# Patient Record
Sex: Female | Born: 1945 | Race: White | Hispanic: No | Smoking: Never smoker
Health system: Southern US, Community
[De-identification: ages and names within clinical notes are randomized; demographics above are authoritative.]

## PROBLEM LIST (undated history)

## (undated) DIAGNOSIS — F0390 Unspecified dementia without behavioral disturbance: Secondary | ICD-10-CM

## (undated) HISTORY — PX: GALLBLADDER SURGERY: SHX652

---

## 2015-10-25 ENCOUNTER — Emergency Department (HOSPITAL_COMMUNITY)
Admission: EM | Admit: 2015-10-25 | Discharge: 2015-10-25 | Disposition: A | Discharge status: Home / Self Care | Payer: Medicare HMO | Attending: Emergency Medicine | Admitting: Emergency Medicine

## 2015-10-25 ENCOUNTER — Encounter (HOSPITAL_COMMUNITY): Payer: Self-pay | Admitting: Emergency Medicine

## 2015-10-25 ENCOUNTER — Emergency Department (HOSPITAL_COMMUNITY): Payer: Medicare HMO

## 2015-10-25 DIAGNOSIS — Y939 Activity, unspecified: Secondary | ICD-10-CM | POA: Diagnosis not present

## 2015-10-25 DIAGNOSIS — S0990XA Unspecified injury of head, initial encounter: Secondary | ICD-10-CM | POA: Diagnosis present

## 2015-10-25 DIAGNOSIS — I10 Essential (primary) hypertension: Secondary | ICD-10-CM | POA: Diagnosis not present

## 2015-10-25 DIAGNOSIS — S0181XA Laceration without foreign body of other part of head, initial encounter: Secondary | ICD-10-CM | POA: Insufficient documentation

## 2015-10-25 DIAGNOSIS — Z23 Encounter for immunization: Secondary | ICD-10-CM | POA: Diagnosis not present

## 2015-10-25 DIAGNOSIS — Y9289 Other specified places as the place of occurrence of the external cause: Secondary | ICD-10-CM | POA: Diagnosis not present

## 2015-10-25 DIAGNOSIS — Z79899 Other long term (current) drug therapy: Secondary | ICD-10-CM | POA: Diagnosis not present

## 2015-10-25 DIAGNOSIS — W19XXXA Unspecified fall, initial encounter: Secondary | ICD-10-CM | POA: Insufficient documentation

## 2015-10-25 DIAGNOSIS — Y999 Unspecified external cause status: Secondary | ICD-10-CM | POA: Diagnosis not present

## 2015-10-25 HISTORY — DX: Unspecified dementia, unspecified severity, without behavioral disturbance, psychotic disturbance, mood disturbance, and anxiety: F03.90

## 2015-10-25 LAB — CBC WITH DIFFERENTIAL/PLATELET
Basophils Absolute: 0 10*3/uL (ref 0.0–0.1)
Basophils Relative: 0 %
Eosinophils Absolute: 0.1 10*3/uL (ref 0.0–0.7)
Eosinophils Relative: 1 %
HEMATOCRIT: 41.5 % (ref 36.0–46.0)
HEMOGLOBIN: 13.3 g/dL (ref 12.0–15.0)
LYMPHS ABS: 1.6 10*3/uL (ref 0.7–4.0)
Lymphocytes Relative: 15 %
MCH: 29.7 pg (ref 26.0–34.0)
MCHC: 32 g/dL (ref 30.0–36.0)
MCV: 92.6 fL (ref 78.0–100.0)
MONO ABS: 0.7 10*3/uL (ref 0.1–1.0)
MONOS PCT: 6 %
NEUTROS ABS: 8.6 10*3/uL — AB (ref 1.7–7.7)
NEUTROS PCT: 78 %
Platelets: 227 10*3/uL (ref 150–400)
RBC: 4.48 MIL/uL (ref 3.87–5.11)
RDW: 13.2 % (ref 11.5–15.5)
WBC: 11 10*3/uL — ABNORMAL HIGH (ref 4.0–10.5)

## 2015-10-25 LAB — BASIC METABOLIC PANEL
Anion gap: 7 (ref 5–15)
BUN: 13 mg/dL (ref 6–20)
CHLORIDE: 107 mmol/L (ref 101–111)
CO2: 29 mmol/L (ref 22–32)
CREATININE: 0.64 mg/dL (ref 0.44–1.00)
Calcium: 8.9 mg/dL (ref 8.9–10.3)
GFR calc non Af Amer: >60 mL/min (ref >60)
GLUCOSE: 98 mg/dL (ref 65–99)
Potassium: 3.5 mmol/L (ref 3.5–5.1)
Sodium: 143 mmol/L (ref 135–145)

## 2015-10-25 MED ORDER — TETANUS-DIPHTH-ACELL PERTUSSIS 5-2.5-18.5 LF-MCG/0.5 IM SUSP
0.5000 mL | Freq: Once | INTRAMUSCULAR | Status: AC
  Administered 2015-10-25: 0.5 mL via INTRAMUSCULAR
  Filled 2015-10-25: qty 0.5

## 2015-10-25 NOTE — ED Notes (Signed)
Patient transported to CT 

## 2015-10-25 NOTE — ED Triage Notes (Addendum)
Per EMS, patient had an unwitnessed fall sometime during the night. Patient found laying in floor beside bed. Patient has a red knot on her forehead. Patient is not complaining of any pain at this time. Hx of dementia. [facility]. Yesterday was the first day the patient was at this facility. Staff unsure of patient's baseline status.

## 2015-10-25 NOTE — ED Notes (Signed)
Bed: WA04 Expected date:  Expected time:  Means of arrival:  Comments: EMS-fall 

## 2015-10-25 NOTE — ED Provider Notes (Signed)
[facility]-EMERGENCY DEPT Provider Note   CSN: [id number] Arrival date & time: 10/25/15  4098     History   Chief Complaint Chief Complaint  Patient presents with  . Fall    HPI Brittany Compton is a 70 y.o. female.  HPI The patient presented to the emergency room after an unwitnessed fall.  The patient is a resident of [facility] at [facility]. She has a history of dementia. She just Moved to this facility.  The patient was found lying on the floor next to her bed. They noted a lump on her right forehead. Patient herself denies any complaints. She denies having any trouble with any headache or neck pain. She denies any pain in her extremities. She denies any recent illness. She is unable to tell me what happened. She does not know where she is. Past Medical History:  Diagnosis Date  . Dementia     There are no active problems to display for this patient.   No past surgical history on file.  OB History    No data available       Home Medications    Prior to Admission medications   Medication Sig Start Date End Date Taking? Authorizing Provider  donepezil (ARICEPT) 10 MG tablet Take 10 mg by mouth at bedtime.   Yes Historical Provider, MD  memantine (NAMENDA) 10 MG tablet Take 10 mg by mouth 2 (two) times daily.   Yes Historical Provider, MD  QUEtiapine (SEROQUEL) 25 MG tablet Take 25 mg by mouth at bedtime.   Yes Historical Provider, MD    Family History No family history on file.  Social History Social History  Substance Use Topics  . Smoking status: Never Smoker  . Smokeless tobacco: Never Used  . Alcohol use No     Allergies   Review of patient's allergies indicates no known allergies.   Review of Systems Review of Systems  All other systems reviewed and are negative.    Physical Exam Updated Vital Signs BP 190/71 (BP Location: Left Arm)   Pulse 63   Temp 98.3 F (36.8 C) (Oral)   Resp 16   Ht 5\' 5"  (1.651 m)   Wt 72.6 kg   SpO2 98%   BMI 26.63  kg/m   Physical Exam  Constitutional: She appears well-developed and well-nourished. No distress.  HENT:  Head: Normocephalic.  Right Ear: External ear normal.  Left Ear: External ear normal.  Cephalhematoma right forehead  Eyes: Conjunctivae are normal. Right eye exhibits no discharge. Left eye exhibits no discharge. No scleral icterus.  Neck: Neck supple. No tracheal deviation present.  Cardiovascular: Normal rate, regular rhythm and intact distal pulses.   Pulmonary/Chest: Effort normal and breath sounds normal. No stridor. No respiratory distress. She has no wheezes. She has no rales.  Abdominal: Soft. Bowel sounds are normal. She exhibits no distension. There is no tenderness. There is no rebound and no guarding.  Musculoskeletal: She exhibits no edema or tenderness.       Right shoulder: She exhibits no tenderness, no bony tenderness and no swelling.       Left shoulder: She exhibits no tenderness, no bony tenderness and no swelling.       Right wrist: She exhibits no tenderness, no bony tenderness and no swelling.       Left wrist: She exhibits no tenderness, no bony tenderness and no swelling.       Right hip: She exhibits normal range of motion, no tenderness, no  bony tenderness and no swelling.       Left hip: She exhibits normal range of motion, no tenderness and no bony tenderness.       Right ankle: She exhibits no swelling. No tenderness.       Left ankle: She exhibits no swelling. No tenderness.       Cervical back: She exhibits no tenderness, no bony tenderness and no swelling.       Thoracic back: She exhibits no tenderness, no bony tenderness and no swelling.       Lumbar back: She exhibits no tenderness, no bony tenderness and no swelling.  No spinal tenderness to palpation  Neurological: She is alert. She has normal strength. No cranial nerve deficit (no facial droop, extraocular movements intact, no slurred speech) or sensory deficit. She exhibits normal muscle tone.  She displays no seizure activity. Coordination normal.  Skin: Skin is warm and dry. No rash noted.  Psychiatric: She has a normal mood and affect.  Nursing note and vitals reviewed.    ED Treatments / Results  Labs (all labs ordered are listed, but only abnormal results are displayed) Labs Reviewed  CBC WITH DIFFERENTIAL/PLATELET - Abnormal; Notable for the following:       Result Value   WBC 11.0 (*)    Neutro Abs 8.6 (*)    All other components within normal limits  BASIC METABOLIC PANEL    EKG  EKG Interpretation  Date/Time:  Tuesday October 25 2015 08:31:15 EDT Ventricular Rate:  70 PR Interval:    QRS Duration: 89 QT Interval:  426 QTC Calculation: 460 R Axis:   -8 Text Interpretation:  Sinus rhythm No old tracing to compare Confirmed by Jahmai Finelli  MD-J, Rolinda Impson ([id number]) on 10/25/2015 8:36:43 AM       Radiology Ct Head Wo Contrast  Result Date: 10/25/2015 CLINICAL DATA:  70 year old female with unwitnessed fall some time in the night. Found down both side bed. Hematoma on forehead. Initial encounter. EXAM: CT HEAD WITHOUT CONTRAST CT CERVICAL SPINE WITHOUT CONTRAST TECHNIQUE: Multidetector CT imaging of the head and cervical spine was performed following the standard protocol without intravenous contrast. Multiplanar CT image reconstructions of the cervical spine were also generated. COMPARISON:  None. FINDINGS: CT HEAD FINDINGS Brain: Cerebral volume loss which is fairly generalized. Questionable disproportionate involvement of the temporal and parietal lobes. Ex vacuo appearing ventricular enlargement. No midline shift, mass effect, or evidence of intracranial mass lesion. No acute intracranial hemorrhage identified. No cortically based acute infarct identified. Patchy white matter hypodensity greater on the right. No cortical encephalomalacia identified. The cerebellum and brainstem appear normal. Vascular: Calcified atherosclerosis at the skull base. No suspicious intracranial  vascular hyperdensity. Skull: Hyperostosis, normal variant.  Calvarium intact. Sinuses/Orbits: Clear; minimal mucous retention cyst inferior right maxillary sinus. Other: Orbits soft tissues appear normal. There is a broad-based right forehead scalp hematoma measuring up to 9 mm tracking cephalad. Underlying frontal bone intact. Negative scalp soft tissues elsewhere. CT CERVICAL SPINE FINDINGS Alignment: Straightening of lower cervical lordosis. Cervicothoracic junction alignment is within normal limits. Trace anterolisthesis of C5 on C6. Bilateral posterior element alignment is within normal limits. Skull base and vertebrae: Visualized skull base is intact. No atlanto-occipital dissociation. No cervical spine fracture identified. Soft tissues and spinal canal: No prevertebral fluid or swelling. No visible canal hematoma. Calcified carotid atherosclerosis. Otherwise negative noncontrast neck soft tissues. Disc levels: Degenerative changes at the anterior C1-odontoid articulation. Intermittent chronic disc and endplate degeneration in the cervical spine with  no CT evidence of spinal stenosis. Upper chest: Grossly intact visualized upper thoracic levels. Negative lung apices. Other: None. IMPRESSION: 1. Right scalp hematoma without underlying fracture. 2.  No acute intracranial abnormality. 3. Cerebral volume loss with ventricular enlargement which is likely ex vacuo in nature. Patchy nonspecific white matter changes. 4. No acute fracture or listhesis identified in the cervical spine. Ligamentous injury is not excluded. Electronically Signed   By: Odessa Fleming M.D.   On: 10/25/2015 09:29   Ct Cervical Spine Wo Contrast  Result Date: 10/25/2015 CLINICAL DATA:  70 year old female with unwitnessed fall some time in the night. Found down both side bed. Hematoma on forehead. Initial encounter. EXAM: CT HEAD WITHOUT CONTRAST CT CERVICAL SPINE WITHOUT CONTRAST TECHNIQUE: Multidetector CT imaging of the head and cervical spine  was performed following the standard protocol without intravenous contrast. Multiplanar CT image reconstructions of the cervical spine were also generated. COMPARISON:  None. FINDINGS: CT HEAD FINDINGS Brain: Cerebral volume loss which is fairly generalized. Questionable disproportionate involvement of the temporal and parietal lobes. Ex vacuo appearing ventricular enlargement. No midline shift, mass effect, or evidence of intracranial mass lesion. No acute intracranial hemorrhage identified. No cortically based acute infarct identified. Patchy white matter hypodensity greater on the right. No cortical encephalomalacia identified. The cerebellum and brainstem appear normal. Vascular: Calcified atherosclerosis at the skull base. No suspicious intracranial vascular hyperdensity. Skull: Hyperostosis, normal variant.  Calvarium intact. Sinuses/Orbits: Clear; minimal mucous retention cyst inferior right maxillary sinus. Other: Orbits soft tissues appear normal. There is a broad-based right forehead scalp hematoma measuring up to 9 mm tracking cephalad. Underlying frontal bone intact. Negative scalp soft tissues elsewhere. CT CERVICAL SPINE FINDINGS Alignment: Straightening of lower cervical lordosis. Cervicothoracic junction alignment is within normal limits. Trace anterolisthesis of C5 on C6. Bilateral posterior element alignment is within normal limits. Skull base and vertebrae: Visualized skull base is intact. No atlanto-occipital dissociation. No cervical spine fracture identified. Soft tissues and spinal canal: No prevertebral fluid or swelling. No visible canal hematoma. Calcified carotid atherosclerosis. Otherwise negative noncontrast neck soft tissues. Disc levels: Degenerative changes at the anterior C1-odontoid articulation. Intermittent chronic disc and endplate degeneration in the cervical spine with no CT evidence of spinal stenosis. Upper chest: Grossly intact visualized upper thoracic levels. Negative lung  apices. Other: None. IMPRESSION: 1. Right scalp hematoma without underlying fracture. 2.  No acute intracranial abnormality. 3. Cerebral volume loss with ventricular enlargement which is likely ex vacuo in nature. Patchy nonspecific white matter changes. 4. No acute fracture or listhesis identified in the cervical spine. Ligamentous injury is not excluded. Electronically Signed   By: Odessa Fleming M.D.   On: 10/25/2015 09:29    Procedures Procedures (including critical care time)  Medications Ordered in ED Medications  Tdap (BOOSTRIX) injection 0.5 mL (0.5 mLs Intramuscular Given 10/25/15 0830)     Initial Impression / Assessment and Plan / ED Course  I have reviewed the triage vital signs and the nursing notes.  Pertinent labs & imaging results that were available during my care of the patient were reviewed by me and considered in my medical decision making (see chart for details).  Clinical Course    Patient has a history of dementia.  I suspect she may have had a mechanical fall but that is not clear. CT scan does not show any serious injury. She has a very minor laceration of the bridge of her nose. Steri-Strips applied. Labs and EKG are reassuring. I think she can be  safely monitored at her nursing facility.  Final Clinical Impressions(s) / ED Diagnoses   Final diagnoses:  Cephalohematoma  Facial laceration, initial encounter    New Prescriptions New Prescriptions   No medications on file     Linwood DibblesJon Jaquala Fuller, MD 10/25/15 1013

## 2015-10-25 NOTE — ED Notes (Signed)
Discharge instructions and follow up care reviewed with patient's daughter. Patient's daughter verbalized understanding.

## 2015-10-25 NOTE — ED Notes (Signed)
MD aware of patient's blood pressure. States patient can be discharged.

## 2015-12-28 ENCOUNTER — Encounter: Payer: Self-pay | Admitting: Neurology

## 2015-12-28 ENCOUNTER — Ambulatory Visit (INDEPENDENT_AMBULATORY_CARE_PROVIDER_SITE_OTHER): Payer: Medicare HMO | Admitting: Neurology

## 2015-12-28 VITALS — BP 124/86 | HR 68

## 2015-12-28 DIAGNOSIS — G300 Alzheimer's disease with early onset: Secondary | ICD-10-CM | POA: Diagnosis not present

## 2015-12-28 DIAGNOSIS — F0280 Dementia in other diseases classified elsewhere without behavioral disturbance: Secondary | ICD-10-CM
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
## 2015-12-28 NOTE — Patient Instructions (Signed)
If you would rather that she not be on Aricept and Namenda, you may discontinue it, as it is not likely to be helpful.   I think seroquel is okay to use for agitation if needed. Follow up as needed.

## 2016-02-09 ENCOUNTER — Ambulatory Visit: Payer: Medicare HMO | Admitting: Neurology

## 2016-03-16 ENCOUNTER — Ambulatory Visit (INDEPENDENT_AMBULATORY_CARE_PROVIDER_SITE_OTHER): Payer: Medicare HMO | Admitting: Neurology

## 2016-03-16 ENCOUNTER — Encounter: Payer: Self-pay | Admitting: Neurology

## 2016-03-16 VITALS — BP 122/68 | HR 80 | Ht 65.0 in | Wt 151.4 lb

## 2016-03-16 DIAGNOSIS — G300 Alzheimer's disease with early onset: Secondary | ICD-10-CM | POA: Diagnosis not present

## 2016-03-16 DIAGNOSIS — F0280 Dementia in other diseases classified elsewhere without behavioral disturbance: Secondary | ICD-10-CM
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
## 2016-03-16 NOTE — Patient Instructions (Signed)
I think the side effects of the potential medications will outweigh any potential benefits.  I think the most important thing is fall precautions.   RESOURCES: Geographical information systems officerenior Resources [facility] [place]  Tel [place]: [phone]  Tel [place]:  [phone]  [email or web address].org/resources.cfm   Resources for common questions found under "Pathways & Protocols "  www.[facility].org/pathways/Pathways_Menu.htm   Resources for [facility] facilities:  www.ncnursinghomeguide.com   You may want to look into [facility].  For assistance with senior care, elder law, and estate planning (POA, medical directives):  [employer]  [address]  [place] [address]  Tel: [phone]  [email or web address]   Rolin BarryKristy Andraos  Tel: [phone]  [email or web address]

## 2016-04-09 ENCOUNTER — Ambulatory Visit: Payer: Medicare HMO | Admitting: Neurology

## 2016-05-15 ENCOUNTER — Emergency Department (HOSPITAL_COMMUNITY): Payer: Medicare HMO

## 2016-05-15 ENCOUNTER — Emergency Department (HOSPITAL_COMMUNITY)
Admission: EM | Admit: 2016-05-15 | Discharge: 2016-05-15 | Disposition: A | Discharge status: Home / Self Care | Payer: Medicare HMO | Attending: Emergency Medicine | Admitting: Emergency Medicine

## 2016-05-15 ENCOUNTER — Encounter (HOSPITAL_COMMUNITY): Payer: Self-pay | Admitting: Emergency Medicine

## 2016-05-15 DIAGNOSIS — R509 Fever, unspecified: Secondary | ICD-10-CM | POA: Diagnosis present

## 2016-05-15 DIAGNOSIS — Z79899 Other long term (current) drug therapy: Secondary | ICD-10-CM | POA: Insufficient documentation

## 2016-05-15 DIAGNOSIS — N390 Urinary tract infection, site not specified: Secondary | ICD-10-CM | POA: Insufficient documentation

## 2016-05-15 LAB — CBC WITH DIFFERENTIAL/PLATELET
Basophils Absolute: 0 10*3/uL (ref 0.0–0.1)
Basophils Relative: 0 %
Eosinophils Absolute: 0.1 10*3/uL (ref 0.0–0.7)
Eosinophils Relative: 1 %
HCT: 39.6 % (ref 36.0–46.0)
Hemoglobin: 12.9 g/dL (ref 12.0–15.0)
LYMPHS ABS: 1 10*3/uL (ref 0.7–4.0)
LYMPHS PCT: 15 %
MCH: 29.3 pg (ref 26.0–34.0)
MCHC: 32.6 g/dL (ref 30.0–36.0)
MCV: 89.8 fL (ref 78.0–100.0)
MONO ABS: 0.8 10*3/uL (ref 0.1–1.0)
MONOS PCT: 12 %
Neutro Abs: 5.1 10*3/uL (ref 1.7–7.7)
Neutrophils Relative %: 72 %
PLATELETS: 222 10*3/uL (ref 150–400)
RBC: 4.41 MIL/uL (ref 3.87–5.11)
RDW: 12.7 % (ref 11.5–15.5)
WBC: 7 10*3/uL (ref 4.0–10.5)

## 2016-05-15 LAB — BASIC METABOLIC PANEL
Anion gap: 11 (ref 5–15)
BUN: 10 mg/dL (ref 6–20)
CO2: 28 mmol/L (ref 22–32)
Calcium: 9 mg/dL (ref 8.9–10.3)
Chloride: 102 mmol/L (ref 101–111)
Creatinine, Ser: 0.79 mg/dL (ref 0.44–1.00)
GFR calc Af Amer: >60 mL/min (ref >60)
Glucose, Bld: 112 mg/dL — ABNORMAL HIGH (ref 65–99)
POTASSIUM: 3.9 mmol/L (ref 3.5–5.1)
Sodium: 141 mmol/L (ref 135–145)

## 2016-05-15 LAB — URINALYSIS, ROUTINE W REFLEX MICROSCOPIC
BILIRUBIN URINE: NEGATIVE
GLUCOSE, UA: NEGATIVE mg/dL
KETONES UR: NEGATIVE mg/dL
LEUKOCYTES UA: NEGATIVE
NITRITE: NEGATIVE
PH: 7 (ref 5.0–8.0)
Protein, ur: NEGATIVE mg/dL
SPECIFIC GRAVITY, URINE: 1.014 (ref 1.005–1.030)

## 2016-05-15 MED ORDER — ACETAMINOPHEN 650 MG RE SUPP
650.0000 mg | Freq: Once | RECTAL | Status: AC
  Administered 2016-05-15: 650 mg via RECTAL
  Filled 2016-05-15: qty 1

## 2016-05-15 MED ORDER — SODIUM CHLORIDE 0.9 % IV BOLUS (SEPSIS)
500.0000 mL | Freq: Once | INTRAVENOUS | Status: AC
  Administered 2016-05-15: 500 mL via INTRAVENOUS

## 2016-05-15 MED ORDER — CIPROFLOXACIN HCL 500 MG PO TABS
500.0000 mg | ORAL_TABLET | Freq: Once | ORAL | Status: AC
  Administered 2016-05-15: 500 mg via ORAL
  Filled 2016-05-15: qty 1

## 2016-05-15 MED ORDER — CEPHALEXIN 250 MG PO CAPS
500.0000 mg | ORAL_CAPSULE | Freq: Once | ORAL | Status: DC
Start: 2016-05-15 — End: 2016-05-15

## 2016-05-15 MED ORDER — ACETAMINOPHEN 325 MG PO TABS: 650.0000 mg | ORAL_TABLET | Freq: Once | ORAL | Status: DC

## 2016-05-15 MED ORDER — CIPROFLOXACIN HCL 500 MG PO TABS: 500.0000 mg | ORAL_TABLET | Freq: Two times a day (BID) | ORAL | 0 refills | Status: DC

## 2016-05-15 MED ORDER — CEPHALEXIN 500 MG PO CAPS: 500.0000 mg | ORAL_CAPSULE | Freq: Four times a day (QID) | ORAL | 0 refills | Status: DC

## 2016-05-15 NOTE — ED Provider Notes (Signed)
[facility]-EMERGENCY DEPT Provider Note   CSN: [id number] Arrival date & time: 05/15/16  1449     History   Chief Complaint Chief Complaint  Patient presents with  . Fever    HPI Brittany Compton is a 71 y.o. female.  HPI  A LEVEL 5 CAVEAT PERTAINS DUE TO DEMENTIA Pt with hx of early onset dementia presnts from nursing home due to concern for weakness.  She was found to have fever on arrival.  Per EMS from staff at [facility] she is at her baseline. There is no report of focal weakness, difficulty breathing, vomiting.    Past Medical History:  Diagnosis Date  . Dementia     There are no active problems to display for this patient.   Past Surgical History:  Procedure Laterality Date  . GALLBLADDER SURGERY      OB History    No data available       Home Medications    Prior to Admission medications   Medication Sig Start Date End Date Taking? Authorizing Provider  ciprofloxacin (CIPRO) 500 MG tablet Take 1 tablet (500 mg total) by mouth every 12 (twelve) hours. 05/15/16   Jerelyn Scott, MD  donepezil (ARICEPT) 10 MG tablet Take 10 mg by mouth at bedtime.    Historical Provider, MD  lisinopril (PRINIVIL,ZESTRIL) 10 MG tablet Take 10 mg by mouth daily.    Historical Provider, MD  memantine (NAMENDA) 10 MG tablet Take 10 mg by mouth 2 (two) times daily.    Historical Provider, MD  QUEtiapine (SEROQUEL) 25 MG tablet Take 25 mg by mouth at bedtime.    Historical Provider, MD    Family History Family History  Problem Relation Age of Onset  . Stroke Mother     Social History Social History  Substance Use Topics  . Smoking status: Never Smoker  . Smokeless tobacco: Never Used  . Alcohol use No     Allergies   Penicillins   Review of Systems Review of Systems  UNABLE TO OBTAIN ROS DUE TO LEVEL 5 CAVEAT   Physical Exam Updated Vital Signs BP 134/68   Pulse 97   Temp (S) (!) 102 F (38.9 C) (Rectal)   Resp 18   Ht  (1.6 m)   Wt 68.5 kg   SpO2 98%   BMI  26.75 kg/m  Vitals reviewed Physical Exam Physical Examination: General appearance - alert, well appearing, and in no distress Mental status - alert, oriented to person, place, and time Eyes - no conjunctival injection, PERRL, EOMI Mouth - mucous membranes moist, pharynx normal without lesions Neck - supple, no significant adenopathy Chest - clear to auscultation, no wheezes, rales or rhonchi, symmetric air entry Heart - normal rate, regular rhythm, normal S1, S2, no murmurs, rubs, clicks or gallops Abdomen - soft, nontender, nondistended, no masses or organomegaly Neurological - awake, alert, smiling and laughing, not able to answer questions, at baseline per daughter, no facial droop, strength symmetric in upper extremities, moving both lower extremities, not able to follow commands, not able to test sensation Extremities - peripheral pulses normal, no pedal edema, no clubbing or cyanosis Skin - normal coloration and turgor, no rashes  ED Treatments / Results  Labs (all labs ordered are listed, but only abnormal results are displayed) Labs Reviewed  BASIC METABOLIC PANEL - Abnormal; Notable for the following:       Result Value   Glucose, Bld 112 (*)    All other components within normal limits  URINALYSIS, ROUTINE W REFLEX MICROSCOPIC - Abnormal; Notable for the following:    Hgb urine dipstick MODERATE (*)    Bacteria, UA MANY (*)    Squamous Epithelial / LPF 0-5 (*)    All other components within normal limits  URINE CULTURE  CBC WITH DIFFERENTIAL/PLATELET    EKG  EKG Interpretation None       Radiology Dg Chest 2 View  Result Date: 05/15/2016 CLINICAL DATA:  Weakness. EXAM: CHEST  2 VIEW COMPARISON:  No recent . FINDINGS: Mediastinum hilar structures normal. Mild cardiomegaly with normal pulmonary vascularity. No focal infiltrate. No pleural effusion or pneumothorax. No acute bony abnormality. Degenerative changes thoracic spine. IMPRESSION: No acute cardiopulmonary  disease. Electronically Signed   By: Maisie Fus  Register   On: 05/15/2016 16:01   Ct Head Wo Contrast  Result Date: 05/15/2016 CLINICAL DATA:  Altered mental status.  Dementia. EXAM: CT HEAD WITHOUT CONTRAST TECHNIQUE: Contiguous axial images were obtained from the base of the skull through the vertex without intravenous contrast. COMPARISON:  CT head 10/25/2015 FINDINGS: Brain: Advanced atrophy, unchanged. Ventricular enlargement consistent with atrophy, unchanged. Negative for acute infarct, hemorrhage, mass. Mild chronic white matter changes. Vascular: Negative for hyperdense vessel Skull: Negative Sinuses/Orbits: Negative Other: None Image quality degraded by motion IMPRESSION: Advanced atrophy. No acute abnormality and no change from the prior study. Electronically Signed   By: Marlan Palau M.D.   On: 05/15/2016 16:25    Procedures Procedures (including critical care time)  Medications Ordered in ED Medications  sodium chloride 0.9 % bolus 500 mL (0 mLs Intravenous Stopped 05/15/16 1741)  acetaminophen (TYLENOL) suppository 650 mg (650 mg Rectal Given 05/15/16 1646)  ciprofloxacin (CIPRO) tablet 500 mg (500 mg Oral Given 05/15/16 1742)     Initial Impression / Assessment and Plan / ED Course  I have reviewed the triage vital signs and the nursing notes.  Pertinent labs & imaging results that were available during my care of the patient were reviewed by me and considered in my medical decision making (see chart for details).     Pt presenting with febrile illness and generalized weakness onset foday.  She has nonfocal neuro exam.  Vitals are reassuring.  No elevation in WBC, or hypotension.  CXR is negative.  Cath urine has many bacteria so will treat for UTI as this is the most likely source- urine culture sent as well.  Pt has PCN allergy so started on course of cipro.  Head CT obtained due to initial report to daughter from NH about facial droop- however neuro exam is nonfocal and  daughter agrees that mom is at her baseline on recheck.  Discharged with strict return precautions.  Pt agreeable with plan.  Final Clinical Impressions(s) / ED Diagnoses   Final diagnoses:  Febrile illness  Urinary tract infection without hematuria, site unspecified    New Prescriptions Discharge Medication List as of 05/15/2016  5:28 PM    START taking these medications   Details  cephALEXin (KEFLEX) 500 MG capsule Take 1 capsule (500 mg total) by mouth 4 (four) times daily., Starting Tue 05/15/2016, Print         Jerelyn Scott, MD 05/16/16 [phone]

## 2016-05-15 NOTE — Discharge Instructions (Signed)
Return to the ED with any concerns including vomiting and not able to keep down liquids or medications, difficulty breathing, decreased level of alertness/lethargy, or any other alarming symptoms

## 2016-05-15 NOTE — ED Triage Notes (Addendum)
To ED via [facility] from [facility] -- with staff there c/o increased weakness -- was called for stroke symptoms-- but tech at nursing home stated that pt has no deficits from normal.  Pt has advanced alzheimer's-- nonverbal, but smiles and is cooperative. Daughter is on her way to hospital.

## 2016-05-17 LAB — URINE CULTURE: Culture: >=100000 — AB

## 2016-05-18 ENCOUNTER — Telehealth: Payer: Self-pay | Admitting: *Deleted

## 2016-05-18 NOTE — Telephone Encounter (Signed)
Post ED Visit - Positive Culture Follow-up  Culture report reviewed by antimicrobial stewardship pharmacist:   Enzo Bi, Pharm.D.  Celedonio Miyamoto, Pharm.D., BCPS AQ-ID  Garvin Fila, Pharm.D., BCPS  Georgina Pillion, Pharm.D., BCPS  [place], [address]D., BCPS, AAHIVP  Estella Husk, Pharm.D., BCPS, AAHIVP  Lysle Pearl, PharmD, BCPS  Casilda Carls, PharmD, BCPS  Pollyann Samples, PharmD, BCPS  Positive urine culture Treated with Cephalexin and Ciprofloxacin, organism sensitive to the same and no further patient follow-up is required at this time.  Virl Axe [facility] 05/18/2016, 11:24 AM

## 2016-11-26 ENCOUNTER — Emergency Department (HOSPITAL_COMMUNITY): Payer: Medicare HMO

## 2016-11-26 ENCOUNTER — Encounter (HOSPITAL_COMMUNITY): Payer: Self-pay

## 2016-11-26 ENCOUNTER — Emergency Department (HOSPITAL_COMMUNITY)
Admission: EM | Admit: 2016-11-26 | Discharge: 2016-11-26 | Disposition: A | Discharge status: Skilled Nursing Facility | Payer: Medicare HMO | Attending: Emergency Medicine | Admitting: Emergency Medicine

## 2016-11-26 DIAGNOSIS — Y9389 Activity, other specified: Secondary | ICD-10-CM | POA: Diagnosis not present

## 2016-11-26 DIAGNOSIS — Z7901 Long term (current) use of anticoagulants: Secondary | ICD-10-CM | POA: Insufficient documentation

## 2016-11-26 DIAGNOSIS — Z79899 Other long term (current) drug therapy: Secondary | ICD-10-CM | POA: Insufficient documentation

## 2016-11-26 DIAGNOSIS — W01198A Fall on same level from slipping, tripping and stumbling with subsequent striking against other object, initial encounter: Secondary | ICD-10-CM | POA: Insufficient documentation

## 2016-11-26 DIAGNOSIS — S01311A Laceration without foreign body of right ear, initial encounter: Secondary | ICD-10-CM | POA: Diagnosis not present

## 2016-11-26 DIAGNOSIS — S00401A Unspecified superficial injury of right ear, initial encounter: Secondary | ICD-10-CM | POA: Diagnosis present

## 2016-11-26 DIAGNOSIS — F0390 Unspecified dementia without behavioral disturbance: Secondary | ICD-10-CM | POA: Insufficient documentation

## 2016-11-26 DIAGNOSIS — Y929 Unspecified place or not applicable: Secondary | ICD-10-CM | POA: Insufficient documentation

## 2016-11-26 DIAGNOSIS — Y999 Unspecified external cause status: Secondary | ICD-10-CM | POA: Diagnosis not present

## 2016-11-26 DIAGNOSIS — W19XXXA Unspecified fall, initial encounter: Secondary | ICD-10-CM

## 2016-11-26 LAB — COMPREHENSIVE METABOLIC PANEL
ALT: 12 U/L — ABNORMAL LOW (ref 14–54)
ANION GAP: 9 (ref 5–15)
AST: 15 U/L (ref 15–41)
Albumin: 3.7 g/dL (ref 3.5–5.0)
Alkaline Phosphatase: 89 U/L (ref 38–126)
BILIRUBIN TOTAL: 0.6 mg/dL (ref 0.3–1.2)
BUN: 19 mg/dL (ref 6–20)
CALCIUM: 8.8 mg/dL — AB (ref 8.9–10.3)
CO2: 28 mmol/L (ref 22–32)
Chloride: 104 mmol/L (ref 101–111)
Creatinine, Ser: 0.8 mg/dL (ref 0.44–1.00)
Glucose, Bld: 131 mg/dL — ABNORMAL HIGH (ref 65–99)
POTASSIUM: 3.7 mmol/L (ref 3.5–5.1)
Sodium: 141 mmol/L (ref 135–145)
TOTAL PROTEIN: 7.2 g/dL (ref 6.5–8.1)

## 2016-11-26 LAB — CBC WITH DIFFERENTIAL/PLATELET
BASOS ABS: 0 10*3/uL (ref 0.0–0.1)
Basophils Relative: 0 %
EOS ABS: 0.1 10*3/uL (ref 0.0–0.7)
EOS PCT: 2 %
HCT: 39.9 % (ref 36.0–46.0)
Hemoglobin: 13 g/dL (ref 12.0–15.0)
Lymphocytes Relative: 19 %
Lymphs Abs: 1.8 10*3/uL (ref 0.7–4.0)
MCH: 30.4 pg (ref 26.0–34.0)
MCHC: 32.6 g/dL (ref 30.0–36.0)
MCV: 93.2 fL (ref 78.0–100.0)
Monocytes Absolute: 0.6 10*3/uL (ref 0.1–1.0)
Monocytes Relative: 6 %
Neutro Abs: 6.7 10*3/uL (ref 1.7–7.7)
Neutrophils Relative %: 73 %
PLATELETS: 275 10*3/uL (ref 150–400)
RBC: 4.28 MIL/uL (ref 3.87–5.11)
RDW: 13.4 % (ref 11.5–15.5)
WBC: 9.2 10*3/uL (ref 4.0–10.5)

## 2016-11-26 LAB — PROTIME-INR
INR: 1
PROTHROMBIN TIME: 13.2 s (ref 11.4–15.2)

## 2016-11-26 LAB — CBG MONITORING, ED: Glucose-Capillary: 94 mg/dL (ref 65–99)

## 2016-11-26 LAB — APTT: APTT: 30 s (ref 24–36)

## 2016-11-26 NOTE — ED Notes (Signed)
ED Provider at bedside. 

## 2016-11-26 NOTE — ED Notes (Signed)
Per daughter, facial droop appears normal

## 2016-11-26 NOTE — Discharge Instructions (Signed)
You were seen here today because of a laceration. A laceration is a cut or lesion that goes through all layers of the skin and into the tissue just beneath the skin. Your laceration has been repaired with dermabond. The film will usually remain in place for 5-10 days, then naturally fall off your skin. Keep the bandaging dry. Replace the dressing daily until the adhesive film has fallen off or if the bandage should become wet. When changing the dressing, do not apply tape directly over the dermabond adhesive film as removing the tape later may also remove the film. Do not apply topical liquids or ointments to the area while the dermabond is in place. This may loosen the film. You may occasionally breifely wet your wound in a shower or bath. Do not soak or scrub your wound. Do not swim. Avoid periods of heavy perspiration. After showering, gently blot your wound dry with a soft towel and apply new clean bandage. Protect your wound from injury. Do not scratch, rub or pick at the Dermabond film.   SEEK MEDICAL CARE IF:  You have redness, swelling, or increasing pain in the wound.  You see a red line that goes away from the wound.  You have yellowish-white fluid (pus) coming from the wound.  You have a fever.  You notice a bad smell coming from the wound or dressing.  Your wound breaks open before or after sutures have been removed.  You notice something coming out of the wound such as wood or glass.  Your wound is on your hand or foot and you cannot move a finger or toe.  Your pain is not controlled with prescribed medicine.  Additional Information:  If you did not receive a tetanus shot today because you thought you were up to date, but did not recall when your last one was given, make sure to check with your primary caregiver to determine if you need one.   Your vital signs today were: BP 136/72    Pulse 60    Temp 97.9 F (36.6 C) (Oral)    Resp 18    SpO2 100%  If your blood pressure (BP) was  elevated above 135/85 this visit, please have this repeated by your doctor within one month..Marland Kitchen

## 2016-11-26 NOTE — ED Notes (Signed)
Bed: WA09 Expected date:  Expected time:  Means of arrival:  Comments: Fall 

## 2016-11-26 NOTE — ED Provider Notes (Signed)
Medical screening examination/treatment/procedure(s) were conducted as a shared visit with non-physician practitioner(s) and myself.  I personally evaluated the patient during the encounter.   EKG Interpretation None     71 year old female here after witnessed fall from nursing home where she slid off the bed to the floor.  Sustained a superficial laceration to her right ear which was repaired with Dermabond.  Head CT and neck CT without acute findings.  She is currently at her baseline.  She is afebrile and will discharge back to her facility   Lorre NickAllen, Aysel Gilchrest, MD 11/26/16 1911

## 2016-11-26 NOTE — ED Triage Notes (Signed)
Per [facility]. Witnessed fall. Pt sitting on bed and slid to floor. Pt presents with laceration to right ear. Bleeding controlled. Pt placed in c-collar by EMS on scene however pt removed. Staff reports pt's neuro status is at baseline.

## 2016-11-26 NOTE — ED Provider Notes (Signed)
[facility]-EMERGENCY DEPT Provider Note   CSN: [id number] Arrival date & time: 11/26/16  1623     History   Chief Complaint Chief Complaint  Patient presents with  . Fall  . Head Laceration    HPI Brittany Compton is a 71 y.o. female with a history of advanced early-onset Alzheimer's dementia who presents via EMS [facility] for [facility].  Patients daughter is present for history. Patient was sitting on bed after using restroom and slid off bed onto floor. There is laceration on right ear. No LOC. [facility] baseline. Patient is primarily non-verbal at baseline.  Level 5 caveat.   HPI  Past Medical History:  Diagnosis Date  . Dementia     There are no active problems to display for this patient.   Past Surgical History:  Procedure Laterality Date  . GALLBLADDER SURGERY      OB History    No data available       Home Medications    Prior to Admission medications   Medication Sig Start Date End Date Taking? Authorizing Provider  ciprofloxacin (CIPRO) 500 MG tablet Take 1 tablet (500 mg total) by mouth every 12 (twelve) hours. 05/15/16   Mabe, Latanya MaudlinMartha L, MD  donepezil (ARICEPT) 10 MG tablet Take 10 mg by mouth at bedtime.    [provider]  lisinopril (PRINIVIL,ZESTRIL) 10 MG tablet Take 10 mg by mouth daily.    [provider]  memantine (NAMENDA) 10 MG tablet Take 10 mg by mouth 2 (two) times daily.    [provider]  QUEtiapine (SEROQUEL) 25 MG tablet Take 25 mg by mouth at bedtime.    [provider]    Family History Family History  Problem Relation Age of Onset  . Stroke Mother     Social History Social History  Substance Use Topics  . Smoking status: Never Smoker  . Smokeless tobacco: Never Used  . Alcohol use No     Allergies   Penicillins   Review of Systems Review of Systems Level 5 caveat  Physical  Exam Updated Vital Signs BP 136/72   Pulse 60   Temp 97.9 F (36.6 C) (Oral)   Resp 18   SpO2 100%   Physical Exam  Constitutional: She appears well-developed and well-nourished.  HENT:  Head: Normocephalic and atraumatic.  Right Ear: Tympanic membrane and external ear normal.  Left Ear: Tympanic membrane and external ear normal.  Nose: Nose normal.  Mouth/Throat: Uvula is midline, oropharynx is clear and moist and mucous membranes are normal. No tonsillar exudate.  Laceration to right ear at top of helix. No damage to cartilage.   Eyes: Pupils are equal, round, and reactive to light. Right eye exhibits no discharge. Left eye exhibits no discharge. No scleral icterus.  Neck: Trachea normal. Neck supple. No spinous process tenderness present. No neck rigidity. Normal range of motion present.  Cardiovascular: Normal rate, regular rhythm and intact distal pulses.   No murmur heard. Pulses:      Radial pulses are 2+ on the right side, and 2+ on the left side.       Dorsalis pedis pulses are 2+ on the right side, and 2+ on the left side.       Posterior tibial pulses are 2+ on the right side, and 2+ on the left side.  No lower extremity swelling or edema. Calves symmetric in  size bilaterally.  Pulmonary/Chest: Effort normal and breath sounds normal. She exhibits no tenderness.  Abdominal: Soft. Bowel sounds are normal. There is no tenderness. There is no rebound and no guarding.  Musculoskeletal: She exhibits no edema.  Lymphadenopathy:    She has no cervical adenopathy.  Neurological: She is alert.  Mental Status:  Patient oriented to self.  Not oriented to time or place.  Primarily nonverbal but does have episodes of speech that is not slurred.  Cranial Nerves:  II:  PERRL III,IV, VI: ptosis not present, extra-ocular motions grossly intact bilaterally but unable to directly test V,VII: right sided drop of smile, eyebrows raise symmetric VIII: unable to assess X: uvula elevates  symmetrically  XI: unable to assess XII: midline tongue extension  Motor: Normal tone. Grossly moves all extremities Sensory: Unable to assess Deep Tendon Reflexes: 2+ and symmetric  Cerebellar: Unable to assess CV: distal pulses palpable throughout    Skin: Skin is warm and dry. No rash noted. She is not diaphoretic.  Psychiatric: She has a normal mood and affect.  Nursing note and vitals reviewed.   ED Treatments / Results  Labs (all labs ordered are listed, but only abnormal results are displayed) Labs Reviewed - No data to display  EKG  EKG Interpretation None       Radiology No results found.  Procedures .Marland KitchenLaceration Repair Date/Time: 11/26/2016 6:54 PM Performed by: Jacinto Halim Authorized by: Jacinto Halim   Consent:    Consent obtained:  Verbal   Consent given by:  Patient and guardian   Risks discussed:  Infection, need for additional repair, poor wound healing, nerve damage, poor cosmetic result, pain, retained foreign body, tendon damage and vascular damage   Alternatives discussed:  No treatment Anesthesia (see MAR for exact dosages):    Anesthesia method:  None Laceration details:    Location:  Ear   Ear location:  R ear   Length (cm):  1 Repair type:    Repair type:  Simple Pre-procedure details:    Preparation:  Patient was prepped and draped in usual sterile fashion Treatment:    Area cleansed with:  Shur-Clens and saline   Amount of cleaning:  Standard   Irrigation solution:  Sterile saline   Irrigation volume:  500   Irrigation method:  Syringe Skin repair:    Repair method:  Tissue adhesive Approximation:    Approximation:  Loose Post-procedure details:    Dressing:  Open (no dressing)   Patient tolerance of procedure:  Tolerated well, no immediate complications   (including critical care time)  Medications Ordered in ED Medications - No data to display   Initial Impression / Assessment and Plan / ED Course  I have  reviewed the triage vital signs and the nursing notes.  Pertinent labs & imaging results that were available during my care of the patient were reviewed by me and considered in my medical decision making (see chart for details).     71 year old female here after witnessed fall from nursing home where she slid off the bed onto the floor.  The patient is at baseline neurologically per EMS report from nursing home and per patient's daughter who is present today.  There is a superficial laceration to her right ear.  CT head and neck reassuring.  Blood work reassuring.  Patient's tetanus is up-to-date.  Repaired laceration with Dermabond.  Will discharge patient back to nursing home facility.  Return precautions discussed with family member and given discharge  instructions. Patient appears safe for discharge.   Final Clinical Impressions(s) / ED Diagnoses   Final diagnoses:  Laceration of right ear, initial encounter  Fall, initial encounter    New Prescriptions New Prescriptions   No medications on file     Princella Pellegrini 11/26/16 2048

## 2016-11-26 NOTE — ED Notes (Signed)
Upon arrival patient had right sided facial droop and right sided weakness. Patient has expressive aphasia and word salad. Patients daughter arrived and stated that everything was her baseline. MD was present in the room with patient at this time assessing her.

## 2017-07-02 ENCOUNTER — Emergency Department (HOSPITAL_COMMUNITY)
Admission: EM | Admit: 2017-07-02 | Discharge: 2017-07-02 | Disposition: A | Discharge status: Home / Self Care | Payer: Medicare HMO | Attending: Emergency Medicine | Admitting: Emergency Medicine

## 2017-07-02 ENCOUNTER — Encounter (HOSPITAL_COMMUNITY): Payer: Self-pay | Admitting: Emergency Medicine

## 2017-07-02 ENCOUNTER — Emergency Department (HOSPITAL_COMMUNITY): Payer: Medicare HMO

## 2017-07-02 DIAGNOSIS — S01411A Laceration without foreign body of right cheek and temporomandibular area, initial encounter: Secondary | ICD-10-CM | POA: Insufficient documentation

## 2017-07-02 DIAGNOSIS — Y998 Other external cause status: Secondary | ICD-10-CM | POA: Diagnosis not present

## 2017-07-02 DIAGNOSIS — Y929 Unspecified place or not applicable: Secondary | ICD-10-CM | POA: Insufficient documentation

## 2017-07-02 DIAGNOSIS — W01198A Fall on same level from slipping, tripping and stumbling with subsequent striking against other object, initial encounter: Secondary | ICD-10-CM | POA: Diagnosis not present

## 2017-07-02 DIAGNOSIS — Z79899 Other long term (current) drug therapy: Secondary | ICD-10-CM | POA: Diagnosis not present

## 2017-07-02 DIAGNOSIS — Y939 Activity, unspecified: Secondary | ICD-10-CM | POA: Diagnosis not present

## 2017-07-02 DIAGNOSIS — F0390 Unspecified dementia without behavioral disturbance: Secondary | ICD-10-CM | POA: Diagnosis not present

## 2017-07-02 DIAGNOSIS — W19XXXA Unspecified fall, initial encounter: Secondary | ICD-10-CM

## 2017-07-02 NOTE — Discharge Instructions (Addendum)
Clean laceration once a day gently with soap and water.  Staples need to be removed next Monday or Tuesday

## 2017-07-02 NOTE — ED Provider Notes (Signed)
[facility]-EMERGENCY DEPT Provider Note   CSN: [id number] Arrival date & time: 07/02/17  1555     History   Chief Complaint Chief Complaint  Patient presents with  . Fall    HPI Brittany Compton is a 72 y.o. female.  Patient is a nursing home patient she fell in her face.  No loss of consciousness.  Patient is severely demented  The history is provided by a relative. No language interpreter was used.  Fall  This is a new problem. The current episode started less than 1 hour ago. The problem occurs rarely. The problem has been resolved. Pertinent negatives include no chest pain. Nothing aggravates the symptoms. Nothing relieves the symptoms.    Past Medical History:  Diagnosis Date  . Dementia     There are no active problems to display for this patient.   Past Surgical History:  Procedure Laterality Date  . GALLBLADDER SURGERY       OB History   None      Home Medications    Prior to Admission medications   Medication Sig Start Date End Date Taking? Authorizing Provider  acetaminophen (TYLENOL) 500 MG tablet Take 500 mg by mouth every 6 (six) hours as needed for mild pain or moderate pain.   Yes [provider]  Skin Protectants, Misc. (BAZA PROTECT EX) Apply 1 application topically 3 (three) times daily. Apply topically to sacral area every shift   Yes [provider]    Family History Family History  Problem Relation Age of Onset  . Stroke Mother     Social History Social History   Tobacco Use  . Smoking status: Never Smoker  . Smokeless tobacco: Never Used  Substance Use Topics  . Alcohol use: No  . Drug use: Not on file     Allergies   Penicillins   Review of Systems Review of Systems  Unable to perform ROS: Dementia  Cardiovascular: Negative for chest pain.     Physical Exam Updated Vital Signs BP (!) 142/94 (BP Location: Right Arm)   Pulse 88   Temp 98.4 F (36.9 C) (Oral)   Resp 18    SpO2 99%   Physical Exam  Constitutional: She appears well-developed.  HENT:  Head: Normocephalic.  1 cm laceration to the right cheek  Eyes: Conjunctivae and EOM are normal. No scleral icterus.  Neck: Neck supple. No thyromegaly present.  Cardiovascular: Normal rate and regular rhythm. Exam reveals no gallop and no friction rub.  No murmur heard. Pulmonary/Chest: No stridor. She has no wheezes. She has no rales. She exhibits no tenderness.  Abdominal: She exhibits no distension. There is no tenderness. There is no rebound.  Musculoskeletal: Normal range of motion. She exhibits no edema.  Lymphadenopathy:    She has no cervical adenopathy.  Neurological: She is alert. She exhibits normal muscle tone. Coordination normal.  Oriented to person only  Skin: No rash noted. No erythema.     ED Treatments / Results  Labs (all labs ordered are listed, but only abnormal results are displayed) Labs Reviewed - No data to display  EKG None  Radiology Ct Head Wo Contrast  Result Date: 07/02/2017 CLINICAL DATA:  Fall. EXAM: CT HEAD WITHOUT CONTRAST CT CERVICAL SPINE WITHOUT CONTRAST TECHNIQUE: Multidetector CT imaging of the head and cervical spine was performed following the standard protocol without intravenous contrast. Multiplanar CT image reconstructions of the cervical spine were also generated. COMPARISON:  CT head and cervical spine dated  November 26, 2016. FINDINGS: CT HEAD FINDINGS Evaluation is limited by motion artifact. Brain: No evidence of acute infarction, hemorrhage, hydrocephalus, extra-axial collection or mass lesion/mass effect. Stable atrophy and chronic microvascular ischemic changes. Vascular: Atherosclerotic vascular calcification of the carotid siphons. No hyperdense vessel. Skull: No definite fracture or focal lesion. Sinuses/Orbits: No acute finding. Other: Soft tissue laceration and subcutaneous emphysema over the right zygoma. CT CERVICAL SPINE FINDINGS Alignment:  Unchanged reversal of the normal cervical lordosis. Trace anterolisthesis at C4-C5 and C5-C6, also unchanged. No traumatic malalignment. Skull base and vertebrae: No acute fracture. No primary bone lesion or focal pathologic process. Soft tissues and spinal canal: No prevertebral fluid or swelling. No visible canal hematoma. Disc levels: Mild disc height loss, endplate spurring, and uncovertebral hypertrophy throughout the cervical spine. Upper chest: Negative. Other: None. IMPRESSION: 1. No acute intracranial abnormality. Soft tissue laceration over the right zygoma. 2.  No acute cervical spine fracture. Electronically Signed   By: Obie DredgeWilliam T Derry M.D.   On: 07/02/2017 17:42   Ct Cervical Spine Wo Contrast  Result Date: 07/02/2017 CLINICAL DATA:  Fall. EXAM: CT HEAD WITHOUT CONTRAST CT CERVICAL SPINE WITHOUT CONTRAST TECHNIQUE: Multidetector CT imaging of the head and cervical spine was performed following the standard protocol without intravenous contrast. Multiplanar CT image reconstructions of the cervical spine were also generated. COMPARISON:  CT head and cervical spine dated November 26, 2016. FINDINGS: CT HEAD FINDINGS Evaluation is limited by motion artifact. Brain: No evidence of acute infarction, hemorrhage, hydrocephalus, extra-axial collection or mass lesion/mass effect. Stable atrophy and chronic microvascular ischemic changes. Vascular: Atherosclerotic vascular calcification of the carotid siphons. No hyperdense vessel. Skull: No definite fracture or focal lesion. Sinuses/Orbits: No acute finding. Other: Soft tissue laceration and subcutaneous emphysema over the right zygoma. CT CERVICAL SPINE FINDINGS Alignment: Unchanged reversal of the normal cervical lordosis. Trace anterolisthesis at C4-C5 and C5-C6, also unchanged. No traumatic malalignment. Skull base and vertebrae: No acute fracture. No primary bone lesion or focal pathologic process. Soft tissues and spinal canal: No prevertebral fluid or  swelling. No visible canal hematoma. Disc levels: Mild disc height loss, endplate spurring, and uncovertebral hypertrophy throughout the cervical spine. Upper chest: Negative. Other: None. IMPRESSION: 1. No acute intracranial abnormality. Soft tissue laceration over the right zygoma. 2.  No acute cervical spine fracture. Electronically Signed   By: Obie DredgeWilliam T Derry M.D.   On: 07/02/2017 17:42    Procedures .Marland Kitchen.Laceration Repair Date/Time: 07/02/2017 6:47 PM Performed by: Bethann BerkshireZammit, Averill Winters, MD Authorized by: Bethann BerkshireZammit, Javares Kaufhold, MD   Comments:     Patient had a 1 cm laceration to her right cheek.  Area was cleaned thoroughly with Betadine.  2 staples were used to close laceration.  Patient tolerated the procedure well   (including critical care time)  Medications Ordered in ED Medications - No data to display   Initial Impression / Assessment and Plan / ED Course  I have reviewed the triage vital signs and the nursing notes.  Pertinent labs & imaging results that were available during my care of the patient were reviewed by me and considered in my medical decision making (see chart for details).     Patient with a fall.  CT head neck shows no acute injuries.  Patient also had a laceration to her cheek that was closed with 2 staples.  She will follow-up with her doctor in a week to get staples out follow-up as needed  Final Clinical Impressions(s) / ED Diagnoses   Final diagnoses:  Fall,  initial encounter    ED Discharge Orders    None       Bethann Berkshire, MD 07/02/17 [phone]

## 2017-07-02 NOTE — ED Triage Notes (Signed)
Per EMS, witnessed fall by staff-patient fell forward while staff was changing her-laceration to right cheek, bleeding controlled-no LOC, back to baseline mentally per staff-

## 2017-07-02 NOTE — ED Notes (Signed)
Bed: [facility] Expected date:  Expected time:  Means of arrival:  Comments: 

## 2017-07-02 NOTE — ED Notes (Signed)
Pt was able to walk with assistance to the restroom

## 2017-07-02 NOTE — ED Notes (Signed)
Pt is CAO per norm per daughter. Pt has dementia and is non verbal. Pt has about inch laceration to her right cheek, bleeding controlled. Pt is moving extremities freely. Unable to do a Neuro due to pt not following commands. Pt is face is symmetrical other then slight swelling by her right eye.

## 2017-10-26 ENCOUNTER — Inpatient Hospital Stay (HOSPITAL_COMMUNITY)
Admission: EM | Admit: 2017-10-26 | Discharge: 2017-10-30 | DRG: 871 | Disposition: A | Discharge status: Nursing Home (Includes ICF) | Payer: Medicare HMO | Attending: Internal Medicine | Admitting: Internal Medicine

## 2017-10-26 ENCOUNTER — Emergency Department (HOSPITAL_COMMUNITY): Payer: Medicare HMO

## 2017-10-26 ENCOUNTER — Encounter (HOSPITAL_COMMUNITY): Payer: Self-pay

## 2017-10-26 DIAGNOSIS — Z79899 Other long term (current) drug therapy: Secondary | ICD-10-CM

## 2017-10-26 DIAGNOSIS — N390 Urinary tract infection, site not specified: Secondary | ICD-10-CM | POA: Diagnosis present

## 2017-10-26 DIAGNOSIS — H5709 Other anomalies of pupillary function: Secondary | ICD-10-CM | POA: Diagnosis present

## 2017-10-26 DIAGNOSIS — R402222 Coma scale, best verbal response, incomprehensible words, at arrival to emergency department: Secondary | ICD-10-CM | POA: Diagnosis present

## 2017-10-26 DIAGNOSIS — R402352 Coma scale, best motor response, localizes pain, at arrival to emergency department: Secondary | ICD-10-CM | POA: Diagnosis present

## 2017-10-26 DIAGNOSIS — R402122 Coma scale, eyes open, to pain, at arrival to emergency department: Secondary | ICD-10-CM | POA: Diagnosis present

## 2017-10-26 DIAGNOSIS — F05 Delirium due to known physiological condition: Secondary | ICD-10-CM | POA: Diagnosis present

## 2017-10-26 DIAGNOSIS — H44001 Unspecified purulent endophthalmitis, right eye: Secondary | ICD-10-CM | POA: Diagnosis not present

## 2017-10-26 DIAGNOSIS — Z993 Dependence on wheelchair: Secondary | ICD-10-CM

## 2017-10-26 DIAGNOSIS — H5789 Other specified disorders of eye and adnexa: Secondary | ICD-10-CM

## 2017-10-26 DIAGNOSIS — R509 Fever, unspecified: Secondary | ICD-10-CM

## 2017-10-26 DIAGNOSIS — H1820 Unspecified corneal edema: Secondary | ICD-10-CM | POA: Diagnosis present

## 2017-10-26 DIAGNOSIS — R739 Hyperglycemia, unspecified: Secondary | ICD-10-CM | POA: Diagnosis present

## 2017-10-26 DIAGNOSIS — A419 Sepsis, unspecified organism: Principal | ICD-10-CM | POA: Diagnosis present

## 2017-10-26 DIAGNOSIS — R319 Hematuria, unspecified: Secondary | ICD-10-CM

## 2017-10-26 DIAGNOSIS — F0390 Unspecified dementia without behavioral disturbance: Secondary | ICD-10-CM | POA: Diagnosis present

## 2017-10-26 DIAGNOSIS — E876 Hypokalemia: Secondary | ICD-10-CM | POA: Diagnosis not present

## 2017-10-26 DIAGNOSIS — Z888 Allergy status to other drugs, medicaments and biological substances status: Secondary | ICD-10-CM | POA: Diagnosis not present

## 2017-10-26 LAB — URINALYSIS, COMPLETE (UACMP) WITH MICROSCOPIC
Bacteria, UA: NONE SEEN
Bilirubin Urine: NEGATIVE
GLUCOSE, UA: NEGATIVE mg/dL
Ketones, ur: NEGATIVE mg/dL
Nitrite: POSITIVE — AB
PH: 6 (ref 5.0–8.0)
PROTEIN: 100 mg/dL — AB
Specific Gravity, Urine: 1.021 (ref 1.005–1.030)
WBC, UA: >50 WBC/hpf — ABNORMAL HIGH (ref 0–5)

## 2017-10-26 LAB — CBC WITH DIFFERENTIAL/PLATELET
Abs Immature Granulocytes: 0 10*3/uL (ref 0.0–0.1)
Basophils Absolute: 0 10*3/uL (ref 0.0–0.1)
Basophils Relative: 0 %
EOS ABS: 0 10*3/uL (ref 0.0–0.7)
Eosinophils Relative: 0 %
HEMATOCRIT: 45.5 % (ref 36.0–46.0)
HEMOGLOBIN: 14.4 g/dL (ref 12.0–15.0)
Immature Granulocytes: 0 %
LYMPHS PCT: 10 %
Lymphs Abs: 1.2 10*3/uL (ref 0.7–4.0)
MCH: 29.6 pg (ref 26.0–34.0)
MCHC: 31.6 g/dL (ref 30.0–36.0)
MCV: 93.4 fL (ref 78.0–100.0)
Monocytes Absolute: 0.5 10*3/uL (ref 0.1–1.0)
Monocytes Relative: 4 %
NEUTROS ABS: 10.4 10*3/uL — AB (ref 1.7–7.7)
Neutrophils Relative %: 86 %
Platelets: 303 10*3/uL (ref 150–400)
RBC: 4.87 MIL/uL (ref 3.87–5.11)
RDW: 12.3 % (ref 11.5–15.5)
WBC: 12.2 10*3/uL — ABNORMAL HIGH (ref 4.0–10.5)

## 2017-10-26 LAB — COMPREHENSIVE METABOLIC PANEL
ALT: 17 U/L (ref 0–44)
ANION GAP: 8 (ref 5–15)
AST: 25 U/L (ref 15–41)
Albumin: 3.5 g/dL (ref 3.5–5.0)
Alkaline Phosphatase: 89 U/L (ref 38–126)
BUN: 12 mg/dL (ref 8–23)
CO2: 25 mmol/L (ref 22–32)
Calcium: 8.8 mg/dL — ABNORMAL LOW (ref 8.9–10.3)
Chloride: 105 mmol/L (ref 98–111)
Creatinine, Ser: 0.75 mg/dL (ref 0.44–1.00)
GFR calc Af Amer: >60 mL/min (ref >60)
GFR calc non Af Amer: >60 mL/min (ref >60)
Glucose, Bld: 140 mg/dL — ABNORMAL HIGH (ref 70–99)
POTASSIUM: 3.6 mmol/L (ref 3.5–5.1)
SODIUM: 138 mmol/L (ref 135–145)
Total Bilirubin: 0.7 mg/dL (ref 0.3–1.2)
Total Protein: 7.3 g/dL (ref 6.5–8.1)

## 2017-10-26 LAB — PHOSPHORUS: Phosphorus: 3.7 mg/dL (ref 2.5–4.6)

## 2017-10-26 LAB — I-STAT CG4 LACTIC ACID, ED: LACTIC ACID, VENOUS: 1.31 mmol/L (ref 0.5–1.9)

## 2017-10-26 LAB — I-STAT TROPONIN, ED: Troponin i, poc: 0.02 ng/mL (ref 0.00–0.08)

## 2017-10-26 LAB — MAGNESIUM: MAGNESIUM: 1.9 mg/dL (ref 1.7–2.4)

## 2017-10-26 MED ORDER — ONDANSETRON HCL 4 MG/2ML IJ SOLN: 4.0000 mg | Freq: Four times a day (QID) | INTRAMUSCULAR | Status: DC | PRN

## 2017-10-26 MED ORDER — LIDOCAINE HCL (PF) 1 % IJ SOLN
10.0000 mL | Freq: Once | INTRAMUSCULAR | Status: AC
  Administered 2017-10-26: 10 mL
  Filled 2017-10-26: qty 10

## 2017-10-26 MED ORDER — SODIUM CHLORIDE 0.9 % IV SOLN
2.0000 g | Freq: Three times a day (TID) | INTRAVENOUS | Status: DC
  Filled 2017-10-26: qty 2

## 2017-10-26 MED ORDER — SODIUM CHLORIDE 0.9 % IV SOLN: 2.0000 g | Freq: Once | INTRAVENOUS | Status: DC

## 2017-10-26 MED ORDER — ACETAMINOPHEN 325 MG PO TABS: 650.0000 mg | ORAL_TABLET | Freq: Four times a day (QID) | ORAL | Status: DC | PRN

## 2017-10-26 MED ORDER — SODIUM CHLORIDE 0.9 % IV BOLUS
1000.0000 mL | Freq: Once | INTRAVENOUS | Status: AC
  Administered 2017-10-26: 1000 mL via INTRAVENOUS

## 2017-10-26 MED ORDER — SODIUM CHLORIDE 0.9 % IV SOLN
Freq: Once | INTRAVENOUS | Status: AC
  Administered 2017-10-26: 21:00:00 via INTRAVENOUS

## 2017-10-26 MED ORDER — LIDOCAINE-EPINEPHRINE (PF) 2 %-1:200000 IJ SOLN
20.0000 mL | Freq: Once | INTRAMUSCULAR | Status: AC
  Administered 2017-10-26: 20 mL
  Filled 2017-10-26: qty 20

## 2017-10-26 MED ORDER — OFLOXACIN 0.3 % OP SOLN
1.0000 [drp] | Freq: Four times a day (QID) | OPHTHALMIC | Status: DC
  Administered 2017-10-26 – 2017-10-29 (×11): 1 [drp] via OPHTHALMIC
  Filled 2017-10-26: qty 5

## 2017-10-26 MED ORDER — IOHEXOL 300 MG/ML  SOLN
75.0000 mL | Freq: Once | INTRAMUSCULAR | Status: AC | PRN
  Administered 2017-10-26: 75 mL via INTRAVENOUS

## 2017-10-26 MED ORDER — HEPARIN SODIUM (PORCINE) 5000 UNIT/ML IJ SOLN
5000.0000 [IU] | Freq: Three times a day (TID) | INTRAMUSCULAR | Status: DC
  Administered 2017-10-26 – 2017-10-30 (×13): 5000 [IU] via SUBCUTANEOUS
  Filled 2017-10-26 (×13): qty 1

## 2017-10-26 MED ORDER — VANCOMYCIN HCL 10 G IV SOLR
1500.0000 mg | Freq: Once | INTRAVENOUS | Status: AC
  Administered 2017-10-26: 1500 mg via INTRAVENOUS
  Filled 2017-10-26: qty 1500

## 2017-10-26 MED ORDER — SODIUM CHLORIDE 0.9 % IV SOLN
2.0000 g | Freq: Two times a day (BID) | INTRAVENOUS | Status: DC
  Administered 2017-10-27 – 2017-10-29 (×5): 2 g via INTRAVENOUS
  Filled 2017-10-26 (×6): qty 2

## 2017-10-26 MED ORDER — SODIUM CHLORIDE 0.9 % IV SOLN
1.0000 g | Freq: Three times a day (TID) | INTRAVENOUS | Status: DC
  Filled 2017-10-26: qty 1

## 2017-10-26 MED ORDER — SODIUM CHLORIDE 0.9 % IV SOLN
INTRAVENOUS | Status: DC
  Administered 2017-10-26 – 2017-10-30 (×9): via INTRAVENOUS

## 2017-10-26 MED ORDER — SODIUM CHLORIDE 0.9 % IV SOLN
2.0000 g | Freq: Once | INTRAVENOUS | Status: AC
  Administered 2017-10-26: 2 g via INTRAVENOUS
  Filled 2017-10-26: qty 2

## 2017-10-26 MED ORDER — ONDANSETRON HCL 4 MG PO TABS: 4.0000 mg | ORAL_TABLET | Freq: Four times a day (QID) | ORAL | Status: DC | PRN

## 2017-10-26 MED ORDER — ACETAMINOPHEN 325 MG RE SUPP
325.0000 mg | Freq: Once | RECTAL | Status: AC
  Administered 2017-10-26: 325 mg via RECTAL
  Filled 2017-10-26: qty 1

## 2017-10-26 MED ORDER — ACETAMINOPHEN 650 MG RE SUPP: 650.0000 mg | Freq: Four times a day (QID) | RECTAL | Status: DC | PRN

## 2017-10-26 MED ORDER — MIDAZOLAM HCL 2 MG/2ML IJ SOLN
4.0000 mg | Freq: Once | INTRAMUSCULAR | Status: AC
  Administered 2017-10-26: 4 mg via INTRAVENOUS
  Filled 2017-10-26: qty 4

## 2017-10-26 MED ORDER — VANCOMYCIN HCL IN DEXTROSE 750-5 MG/150ML-% IV SOLN
750.0000 mg | Freq: Two times a day (BID) | INTRAVENOUS | Status: DC
  Filled 2017-10-26: qty 150

## 2017-10-26 MED ORDER — SODIUM CHLORIDE 0.9 % IV SOLN
2.0000 g | Freq: Once | INTRAVENOUS | Status: DC
  Filled 2017-10-26: qty 2

## 2017-10-26 NOTE — Progress Notes (Signed)
Patient is admitted to 5 W with the diagnosis of sepsis due to UTI. Patient is lethargic and as such unable to complete  admission profile and will be done when the family comes to the bedside. No s/s of pain or any distress noted. Noted stage 2 wound in between butt cheek. Wound consult placed. Will continue to monitor.

## 2017-10-26 NOTE — Progress Notes (Signed)
Pharmacy Antibiotic Note  Brittany Compton is a 72 y.o. female admitted on 10/26/2017 with r/o meningitis.  Pharmacy has been consulted for Vancomycin and Meropenem dosing. SCr 0.75. La 1.31. WBC 12.2. nCrCl ~ 80 ml/min    Plan: -Vancomycin 1500 mg IV once, then start vancomycin 750 mg IV Q 12 hours -Meropenem 2 gm IV Q 8 hours -Monitor CBC, renal fx, cultures and clinical progress -VT at SS      Temp (24hrs), Avg:100.6 F (38.1 C), Min:100.6 F (38.1 C), Max:100.6 F (38.1 C)  No results for input(s): WBC, CREATININE, LATICACIDVEN, VANCOTROUGH, VANCOPEAK, VANCORANDOM, GENTTROUGH, GENTPEAK, GENTRANDOM, TOBRATROUGH, TOBRAPEAK, TOBRARND, AMIKACINPEAK, AMIKACINTROU, AMIKACIN in the last 168 hours.  CrCl cannot be calculated (Patient's most recent lab result is older than the maximum 21 days allowed.).    Allergies  Allergen Reactions  . Penicillins     Has patient had a PCN reaction causing immediate rash, facial/tongue/throat swelling, SOB or lightheadedness with hypotension: Unknown Has patient had a PCN reaction causing severe rash involving mucus membranes or skin necrosis: Unknown Has patient had a PCN reaction that required hospitalization: Unknown Has patient had a PCN reaction occurring within the last 10 years: Unknown If all of the above answers are "NO", then may proceed with Cephalosporin use.     Antimicrobials this admission: Meropenem 9/28 >>  Vancomycin 9/28 >>   Dose adjustments this admission: None   Microbiology results:   Thank you for allowing pharmacy to be a part of this patient's care.  Vinnie Level, PharmD., BCPS Clinical Pharmacist Clinical phone for 10/26/17 until 8:30pm: [phone]

## 2017-10-26 NOTE — Progress Notes (Signed)
Pharmacy Antibiotic Note  Brittany Compton is a 72 y.o. female admitted on 10/26/2017 originally with concern for meningitis however now with concern for UTI. Pharmacy has been consulted to transition antibiotics to Cefepime monotherapy.   The patient does have an unknown allergy listed to penicillins. Will monitor closely with initial Cefepime dose as discussed with MD.   SCr 0.75, CrCl~60 ml/min   Plan: - D/c Vanc/Mero - Start Cefepime 2g IV every 12 hours - Will continue to follow renal function, culture results, LOT, and antibiotic de-escalation plans      Temp (24hrs), Avg:100.7 F (38.2 C), Min:100.6 F (38.1 C), Max:100.7 F (38.2 C)  Recent Labs  Lab 10/26/17 1559 10/26/17 1601  WBC  --  12.2*  CREATININE  --  0.75  LATICACIDVEN 1.31  --     CrCl cannot be calculated (Unknown ideal weight.).    Allergies  Allergen Reactions  . Penicillins Other (See Comments)    Unknown reaction Has patient had a PCN reaction causing immediate rash, facial/tongue/throat swelling, SOB or lightheadedness with hypotension: Unknown Has patient had a PCN reaction causing severe rash involving mucus membranes or skin necrosis: Unknown Has patient had a PCN reaction that required hospitalization: Unknown Has patient had a PCN reaction occurring within the last 10 years: Unknown If all of the above answers are "NO", then may proceed with Cephalosporin use.     Antimicrobials this admission: Meropenem 9/28 x 1 Vancomycin 9/28 x 1 Cefepime 9/29 >>   Dose adjustments this admission: None   Microbiology results: 9/28 UCx >> 9/28 BCx >>  Thank you for allowing pharmacy to be a part of this patient's care.  Georgina Pillion, PharmD, BCPS Clinical Pharmacist Pager: [phone] Please check AMION for all [facility] numbers 10/26/2017 9:39 PM

## 2017-10-26 NOTE — ED Triage Notes (Signed)
Pt presents for evaluation of decreased activity and lethargy. Lives at [facility], is nonverbal and wheelchair bound at baseline. Facility states LKW last night. Reports drooling to L mouth, leaning more towards L side. Facility reports patient is typically very mobile in her wheelchair and today did not get out of bed.

## 2017-10-26 NOTE — ED Provider Notes (Addendum)
[facility] EMERGENCY DEPARTMENT Provider Note   CSN: [id number] Arrival date & time: 10/26/17  1444     History   Chief Complaint Chief Complaint  Patient presents with  . Altered Mental Status    HPI Brittany Compton is a 72 y.o. female.  HPI  Level 5 caveat altered mental status.  Collateral information obtained from patient's nurse at [facility],[place].  According to RN, patient was last at her baseline at 3 PM yesterday.  At that time, she was seen leaning to the right in an upright position, but no focal neurologic deficits noticed by staff.  Patient typically sits in her wheelchair upright at baseline with normal tone, and is nonverbal, however she will "holler".  Patient does not follow commands at baseline, and the maximum that she can follow is instructions to stand.  RN also noted that patient did not have normal eye-opening, and appeared to not want to open her right eye, noticing that it was more swollen.  Per [facility], patient is a full code.  Past Medical History:  Diagnosis Date  . Dementia     There are no active problems to display for this patient.   Past Surgical History:  Procedure Laterality Date  . GALLBLADDER SURGERY       OB History   None      Home Medications    Prior to Admission medications   Medication Sig Start Date End Date Taking? Authorizing Provider  acetaminophen (TYLENOL) 500 MG tablet Take 500 mg by mouth every 6 (six) hours as needed for mild pain or moderate pain.    [provider]  Skin Protectants, Misc. (BAZA PROTECT EX) Apply 1 application topically 3 (three) times daily. Apply topically to sacral area every shift    [provider]    Family History Family History  Problem Relation Age of Onset  . Stroke Mother     Social History Social History   Tobacco Use  . Smoking status: Never Smoker  . Smokeless tobacco: Never Used  Substance Use  Topics  . Alcohol use: No  . Drug use: Not on file     Allergies   Penicillins   Review of Systems Review of Systems  5 caveat altered mental status.  Physical Exam Updated Vital Signs BP (!) 152/55 (BP Location: Right Arm)   Pulse (!) 109   Temp (!) 100.6 F (38.1 C) (Oral)   Resp 18   SpO2 96%   Physical Exam  Constitutional: She appears well-developed and well-nourished.  Curled inward with eyes closed. Making soft noises.   HENT:  Head: Normocephalic and atraumatic.  Mouth/Throat: Oropharynx is clear and moist.  See clinical photo right eye for details.  Patient has diffuse scleral and conjunctival injection.  She has periorbital edema with mild erythema, not well-circumscribed.  No foreign body was identified on lid eversion.  Eyes: Pupils are equal, round, and reactive to light. Conjunctivae and EOM are normal.  Neck: Neck supple.  Cardiovascular: Normal rate, regular rhythm, S1 normal and S2 normal.  No murmur heard. Pulmonary/Chest: Effort normal and breath sounds normal. She has no wheezes. She has no rales.  Diminished lung sounds   Abdominal: Soft. She exhibits no distension. There is no tenderness. There is no guarding.  Genitourinary:  Genitourinary Comments: Perineum and buttocks examined with nurse tech chaperone present.  Patient has blanching erythema surrounding buttocks, but no ulcerations, induration, or circumscribed erythema.  Musculoskeletal:  Normal range of motion. She exhibits no edema or deformity.  Neurological: She is alert. GCS eye subscore is 2. GCS verbal subscore is 2. GCS motor subscore is 5.  Patient has increased tone in all extremities, but is moving them symmetrically.  Patient localizes pain in all extremities.  Skin: Skin is warm and dry. No rash noted. No erythema.  Skin of all extremities, back and trunk is warm without erythema, foreign bodies, or medication patches.  Psychiatric:  Unable to assess. Not follow commands or verbal.    Nursing note and vitals reviewed.      ED Treatments / Results  Labs (all labs ordered are listed, but only abnormal results are displayed) Labs Reviewed  CULTURE, BLOOD (ROUTINE X 2)  CULTURE, BLOOD (ROUTINE X 2)  URINE CULTURE  COMPREHENSIVE METABOLIC PANEL  CBC WITH DIFFERENTIAL/PLATELET  URINALYSIS, COMPLETE (UACMP) WITH MICROSCOPIC  I-STAT CG4 LACTIC ACID, ED  I-STAT TROPONIN, ED    EKG  ED ECG REPORT   Date: 10/26/2017  Rate: 82  Rhythm: normal sinus rhythm  QRS Axis: left  Intervals: normal  ST/T Wave abnormalities: normal  Conduction Disutrbances: Unable to assess R wave progression due to artifact.  Narrative Interpretation:   I have personally reviewed the EKG tracing and agree with the computerized printout as noted. Reviewed by Dr. Margarita Grizzle.   Radiology No results found.  Procedures Procedures (including critical care time)  CRITICAL CARE Performed by: Elisha Ponder   Total critical care time: 35 minutes  Critical care time was exclusive of separately billable procedures and treating other patients.  Critical care was necessary to treat or prevent imminent or life-threatening deterioration.  Critical care was time spent personally by me on the following activities: development of treatment plan with patient and/or surrogate as well as nursing, discussions with consultants, evaluation of patient's response to treatment, examination of patient, obtaining history from patient or surrogate, ordering and performing treatments and interventions, ordering and review of laboratory studies, ordering and review of radiographic studies, pulse oximetry and re-evaluation of patient's condition.   Medications Ordered in ED Medications  sodium chloride 0.9 % bolus 1,000 mL (has no administration in time range)  ceFEPIme (MAXIPIME) 2 g in sodium chloride 0.9 % 100 mL IVPB (has no administration in time range)  vancomycin (VANCOCIN) 1,500 mg in sodium  chloride 0.9 % 500 mL IVPB (has no administration in time range)     Initial Impression / Assessment and Plan / ED Course  I have reviewed the triage vital signs and the nursing notes.  Pertinent labs & imaging results that were available during my care of the patient were reviewed by me and considered in my medical decision making (see chart for details).  Clinical Course as of Oct 26 2108  Sat Oct 26, 2017  1601 Discussed case with pharmacist Irving Burton who recommends Meropenem + Vancomycin if concerned about meningitis as part of Ddx.    [AM]  1857 Likely source of sepsis.  Urinalysis, Complete w Microscopic(!) [AM]  2105 Spoke with Dr. Robb Matar of [facility] hospitalist will admit patient.  I appreciate his involvement in the care of this patient.   [AM]    Clinical Course User Index [AM] Elisha Ponder, PA-C    Patient is critically ill and requiring a higher level of care. Sepsis suspected. Code sepsis called. Patient meeting SIRS criteria based on fever, tachycardia, leukocytosis. See vitals below. Suspected source of infection urinary or ophthalmic based on history and exam. Lactic  acid normal.   Given patient's increased muscular tone on exam and altered mental status above baseline with nonverbal status, meningitis was considered.  Lumbar puncture was attempted by Dr. Margarita Grizzle and Dr. Parke Simmers, resident but no CSF returned and patient was appropriately covered with antibiotics per the discussion with pharmacy, however given the clear urinary source, do not feel that meningitis is the cause of patient's sepsis.  Patient does appear to have right eye abnormality, inflammatory versus infectious.  Will cover patient with ofloxacin drops since there is significant conjunctival involvement.  Vitals:   10/26/17 1550 10/26/17 1615 10/26/17 1634 10/26/17 1800  BP: (!) 134/116 (!) 147/131    Pulse: (!) 107   89  Resp:    20  Temp:   (!) 100.7 F (38.2 C)   TempSrc:   Rectal   SpO2: 100%    100%     Broad spectrum antibiotics initiated based on suspected source of infection.  Initial 2 L bolus administered followed by maintenance fluid.  Patient remained without elevated lactic acid nor hypotension.  Sepsis - Repeat Assessment  Performed at:    1800  Vitals     Blood pressure (!) 147/131, pulse 89, temperature (!) 100.7 F (38.2 C), temperature source Rectal, resp. rate 20, SpO2 100 %.  Heart:     Regular rate and rhythm  Lungs:    CTA  Capillary Refill:   <2 sec  Peripheral Pulse:   Dorsalis pedis pulse  palpable  Skin:     Normal Color   Spoke with hospitalist Dr. Robb Matar who has decided to admit patient to a high level of care. Appreciate his involvement.   Final Clinical Impressions(s) / ED Diagnoses   Final diagnoses:  Sepsis, due to unspecified organism  Urinary tract infection with hematuria, site unspecified  Eye swelling, right    ED Discharge Orders    None         Delia Chimes 10/26/17 2342    Margarita Grizzle, MD 10/28/17 [phone]

## 2017-10-26 NOTE — ED Notes (Signed)
Patient transported to X-ray 

## 2017-10-26 NOTE — H&P (Signed)
History and Physical    Brittany Compton ZOX:[id number] DOB: 1945-11-17 DOA: 10/26/2017  PCP: Almetta Lovely, [facility]   Patient coming from: Nursing home facility.  I have personally briefly reviewed patient's old medical records in [facility]  Chief Complaint: AMS.  HPI: Brittany Compton is a 72 y.o. female with medical history significant of dementia who [facility] who is being sent to the ER due to decreased activity level and lethargy.  Her baseline is usually nonverbal and wheelchair bound.  However, the facility reported that she has been leaning towards the left side and drooling from her left side of her mouth as well.  She is usually very mobile in her wheelchair, however she did not get out of bed today.  No further history available.  Neither the facility of loss have been able to contact the patient's daughter.  ED Course: Initial vital signs temperature 100.6 F, pulse 190, respirations 18, blood pressure 152/55 mmHg and O2 sat 96% on room air.The patient received a 2000 mL NS bolus, meropenem 2 g and vancomycin 1500 mg IVPB.  Initially meningitis was suspected and an unsuccessful LP was attempted.  However after ED staff discussed the patient's baseline with the nursing home personnel, which mentioned that she is usually slightly rigid in her neck and upper back.  However, after urinalysis results are back.  I have switched the antibiotic coverage to cefepime.  Her urinalysis showed moderate hemoglobinuria, 100mg /dL of proteinuria, positive nitrates, large leukocyte esterase, 21-50 RBCs and more than 50 WBC per hpf.  There was no bacteria seen.  CBC showed a white count 12.2 with 86% neutrophils and 10% lymphocytes.  Hemoglobin 14.4 g/dL and platelets 811.  Her CMP shows a glucose of 140 and calcium of 8.8 mg/dL.  All other values are within normal limits.  Imaging: Her chest radiograph showed mild bronchitic change, but no acute findings.  CT head without contrast  and CT orbits with contrast did not show any acute intracranial pathology.  However it showed mild edema of the right eyelid with small underlying fluid collection between the eyelid and the globe.  There was no evidence for orbital cellulitis.  Please see images and full radiology report for further detail.  Review of Systems: Unable to obtain.   Past Medical History:  Diagnosis Date  . Dementia     Past Surgical History:  Procedure Laterality Date  . GALLBLADDER SURGERY       reports that she has never smoked. She has never used smokeless tobacco. She reports that she does not drink alcohol. Her drug history is not on file.  Allergies  Allergen Reactions  . Penicillins Other (See Comments)    Unknown reaction Has patient had a PCN reaction causing immediate rash, facial/tongue/throat swelling, SOB or lightheadedness with hypotension: Unknown Has patient had a PCN reaction causing severe rash involving mucus membranes or skin necrosis: Unknown Has patient had a PCN reaction that required hospitalization: Unknown Has patient had a PCN reaction occurring within the last 10 years: Unknown If all of the above answers are "NO", then may proceed with Cephalosporin use.     Family History  Problem Relation Age of Onset  . Stroke Mother     Prior to Admission medications   Medication Sig Start Date End Date Taking? Authorizing Provider  acetaminophen (TYLENOL) 500 MG tablet Take 500 mg by mouth every 6 (six) hours as needed (pain).    Yes [provider]  dextromethorphan-guaiFENesin (TUSSIN  DM) 10-100 MG/5ML liquid Take 10 mLs by mouth every 6 (six) hours as needed for cough.   Yes [provider]  Melatonin 3 MG TABS Take 3 mg by mouth at bedtime.   Yes [provider]  Skin Protectants, Misc. (BAZA PROTECT EX) Apply 1 application topically See admin instructions. Apply topically to sacral area every shift    Yes [provider]    Physical  Exam: Vitals:   10/26/17 2000 10/26/17 2100 10/26/17 2115 10/26/17 2130  BP: (!) 150/80 139/74    Pulse: 82 81 81 93  Resp: 15 15 15    Temp:      TempSrc:      SpO2: 97% 95% 95% 98%    Constitutional: Obtunded.  Looks acutely ill. Eyes: PERRL, right lid and conjunctivae show significant injection and edema.  Please see pictures below. ENMT: Mucous membranes are dry. Posterior pharynx clear of any exudate or lesions. Neck: normal, supple, no masses, no thyromegaly Respiratory: Decreased breath sounds on bases, otherwise clear to auscultation bilaterally, no wheezing, no crackles. Normal respiratory effort. No accessory muscle use.  Cardiovascular: Regular rate and rhythm, no murmurs / rubs / gallops. No extremity edema. 2+ pedal pulses. No carotid bruits.  Abdomen: Soft, no tenderness, no masses palpated. No hepatosplenomegaly. Bowel sounds positive.  Musculoskeletal: no clubbing / cyanosis. Good ROM, no contractures. Normal muscle tone.  Skin: no rashes, lesions, ulcers on very limited examination.  Unable to evaluate back. Neurologic: Obtunded. Psychiatric: Obtunded.       Labs on Admission: I have personally reviewed following labs and imaging studies  CBC: Recent Labs  Lab 10/26/17 1601  WBC 12.2*  NEUTROABS 10.4*  HGB 14.4  HCT 45.5  MCV 93.4  PLT 303   Basic Metabolic Panel: Recent Labs  Lab 10/26/17 1601  NA 138  K 3.6  CL 105  CO2 25  GLUCOSE 140*  BUN 12  CREATININE 0.75  CALCIUM 8.8*   GFR: CrCl cannot be calculated (Unknown ideal weight.). Liver Function Tests: Recent Labs  Lab 10/26/17 1601  AST 25  ALT 17  ALKPHOS 89  BILITOT 0.7  PROT 7.3  ALBUMIN 3.5   No results for input(s): LIPASE, AMYLASE in the last 168 hours. No results for input(s): AMMONIA in the last 168 hours. Coagulation Profile: No results for input(s): INR, PROTIME in the last 168 hours. Cardiac Enzymes: No results for input(s): CKTOTAL, CKMB, CKMBINDEX, TROPONINI in  the last 168 hours. BNP (last 3 results) No results for input(s): PROBNP in the last 8760 hours. HbA1C: No results for input(s): HGBA1C in the last 72 hours. CBG: No results for input(s): GLUCAP in the last 168 hours. Lipid Profile: No results for input(s): CHOL, HDL, LDLCALC, TRIG, CHOLHDL, LDLDIRECT in the last 72 hours. Thyroid Function Tests: No results for input(s): TSH, T4TOTAL, FREET4, T3FREE, THYROIDAB in the last 72 hours. Anemia Panel: No results for input(s): VITAMINB12, FOLATE, FERRITIN, TIBC, IRON, RETICCTPCT in the last 72 hours. Urine analysis:    Component Value Date/Time   COLORURINE YELLOW 10/26/2017 1628   APPEARANCEUR CLOUDY (A) 10/26/2017 1628   LABSPEC 1.021 10/26/2017 1628   PHURINE 6.0 10/26/2017 1628   GLUCOSEU NEGATIVE 10/26/2017 1628   HGBUR MODERATE (A) 10/26/2017 1628   BILIRUBINUR NEGATIVE 10/26/2017 1628   KETONESUR NEGATIVE 10/26/2017 1628   PROTEINUR 100 (A) 10/26/2017 1628   NITRITE POSITIVE (A) 10/26/2017 1628   LEUKOCYTESUR LARGE (A) 10/26/2017 1628    Radiological Exams on Admission: Dg Chest 2  View  Result Date: 10/26/2017 CLINICAL DATA:  Pt presents for evaluation of decreased activity and lethargy. Lives at [facility], is nonverbal and wheelchair bound at baseline. Facility states LKW last night. Reports drooling to L mouth, leaning more towards L side. Facilit.*comment was truncated* EXAM: CHEST - 2 VIEW COMPARISON:  Radiograph 11/06/2016 FINDINGS: Normal cardiac silhouette. Low lung volumes. Mild bronchitic change. No effusion, infiltrate pneumothorax. Degenerate spurring of spine. IMPRESSION: Mild bronchitic change.  No acute findings.  Low lung volumes. Electronically Signed   By: Genevive Bi M.D.   On: 10/26/2017 16:39   Ct Head Wo Contrast  Result Date: 10/26/2017 CLINICAL DATA:  Altered mental status. EXAM: CT HEAD WITHOUT CONTRAST CT ORBITS WITH CONTRAST TECHNIQUE: Contiguous axial images were obtained from the base of the  skull through the vertex without contrast. Multidetector CT imaging of the orbits was performed using the standard protocol with intravenous contrast. CONTRAST:  75mL OMNIPAQUE IOHEXOL 300 MG/ML  SOLN COMPARISON:  Head CT 07/02/2017. FINDINGS: CT HEAD FINDINGS Brain: There is no mass, hemorrhage or extra-axial collection. There is generalized brain atrophy greater than expected at this age. There is no acute or chronic infarction. There is hypoattenuation of the periventricular white matter, most commonly indicating chronic ischemic microangiopathy. Vascular: No abnormal hyperdensity of the major intracranial arteries or dural venous sinuses. No intracranial atherosclerosis. Skull: The visualized skull base, calvarium and extracranial soft tissues are normal. CT ORBITS FINDINGS The orbits examination is severely motion degraded. Orbits: --Globes: Normal. --Bony orbit: Normal. --Preseptal soft tissues: There is a small fluid collection just anterior to the right globe. This may be fluid trapped beneath the eyelid. There is mild right eyelid edema. --Intra- and extraconal orbital fat: Normal. No inflammatory stranding. --Optic nerves: Normal. --Lacrimal glands and fossae: Normal. --Extraocular muscles: Normal. Visualized sinuses:  No fluid levels or advanced mucosal thickening. Soft tissues: Normal. IMPRESSION: 1. No acute intracranial abnormality. 2. 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 3. Severely motion degraded orbital examination. 4. Mild edema of the right eyelid with small underlying fluid collection between the eyelid and the globe. This may be simple fluid trapped beneath the eyelid. Superficial inflammatory processes such as chalazion are also possibilities. An abscess within the eyelid is less likely. Direct visualization is recommended. 5. No evidence for orbital (postseptal) cellulitis. Electronically Signed   By: Deatra Robinson M.D.   On: 10/26/2017 18:36   Ct Orbits W  Contrast  Result Date: 10/26/2017 CLINICAL DATA:  Altered mental status. EXAM: CT HEAD WITHOUT CONTRAST CT ORBITS WITH CONTRAST TECHNIQUE: Contiguous axial images were obtained from the base of the skull through the vertex without contrast. Multidetector CT imaging of the orbits was performed using the standard protocol with intravenous contrast. CONTRAST:  75mL OMNIPAQUE IOHEXOL 300 MG/ML  SOLN COMPARISON:  Head CT 07/02/2017. FINDINGS: CT HEAD FINDINGS Brain: There is no mass, hemorrhage or extra-axial collection. There is generalized brain atrophy greater than expected at this age. There is no acute or chronic infarction. There is hypoattenuation of the periventricular white matter, most commonly indicating chronic ischemic microangiopathy. Vascular: No abnormal hyperdensity of the major intracranial arteries or dural venous sinuses. No intracranial atherosclerosis. Skull: The visualized skull base, calvarium and extracranial soft tissues are normal. CT ORBITS FINDINGS The orbits examination is severely motion degraded. Orbits: --Globes: Normal. --Bony orbit: Normal. --Preseptal soft tissues: There is a small fluid collection just anterior to the right globe. This may be fluid trapped beneath the eyelid. There is mild right eyelid edema. --  Intra- and extraconal orbital fat: Normal. No inflammatory stranding. --Optic nerves: Normal. --Lacrimal glands and fossae: Normal. --Extraocular muscles: Normal. Visualized sinuses:  No fluid levels or advanced mucosal thickening. Soft tissues: Normal. IMPRESSION: 1. No acute intracranial abnormality. 2. 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 3. Severely motion degraded orbital examination. 4. Mild edema of the right eyelid with small underlying fluid collection between the eyelid and the globe. This may be simple fluid trapped beneath the eyelid. Superficial inflammatory processes such as chalazion are also possibilities. An abscess within the  eyelid is less likely. Direct visualization is recommended. 5. No evidence for orbital (postseptal) cellulitis. Electronically Signed   By: Deatra Robinson M.D.   On: 10/26/2017 18:36    EKG: Independently reviewed.  Vent. rate 82 BPM PR interval * ms QRS duration 84 ms QT/QTc 394/461 ms P-R-T axes 39 -29 34  Assessment/Plan Principal Problem:   Sepsis secondary to UTI (HCC) Admit to stepdown/inpatient. Continue supplemental oxygen. Continue IV fluids. Neuro checks every 4 hours. Cefepime per pharmacy. Follow-up blood culture and sensitivity. Follow urine culture and sensitivity.  Active Problems:   Dementia (HCC) Supportive care.    Hyperglycemia Follow-up glucose level in a.m.    Infection of right eye Continue Ocuflox drops. Consult ophthalmology in a.m. if it has not improved.   DVT prophylaxis: Heparin SQ. Code Status: Full code. Family Communication: None at bedside. Disposition Plan: Admit for IV antibiotic therapy for several days. Consults called:  Admission status: Inpatient/stepdown.   Bobette Mo MD [facility] Pager [phone].  If 7PM-7AM, please contact night-coverage [email or web address] Password [email or web address]  10/26/2017, 10:10 PM

## 2017-10-26 NOTE — ED Notes (Signed)
Attempted report 

## 2017-10-27 DIAGNOSIS — H5789 Other specified disorders of eye and adnexa: Secondary | ICD-10-CM

## 2017-10-27 DIAGNOSIS — F0390 Unspecified dementia without behavioral disturbance: Secondary | ICD-10-CM

## 2017-10-27 DIAGNOSIS — L8990 Pressure ulcer of unspecified site, unspecified stage: Secondary | ICD-10-CM | POA: Insufficient documentation

## 2017-10-27 LAB — COMPREHENSIVE METABOLIC PANEL
ALK PHOS: 72 U/L (ref 38–126)
ALT: 15 U/L (ref 0–44)
ANION GAP: 7 (ref 5–15)
AST: 20 U/L (ref 15–41)
Albumin: 2.9 g/dL — ABNORMAL LOW (ref 3.5–5.0)
BILIRUBIN TOTAL: 1 mg/dL (ref 0.3–1.2)
BUN: 7 mg/dL — ABNORMAL LOW (ref 8–23)
CO2: 26 mmol/L (ref 22–32)
Calcium: 8.1 mg/dL — ABNORMAL LOW (ref 8.9–10.3)
Chloride: 107 mmol/L (ref 98–111)
Creatinine, Ser: 0.67 mg/dL (ref 0.44–1.00)
GFR calc non Af Amer: >60 mL/min (ref >60)
Glucose, Bld: 114 mg/dL — ABNORMAL HIGH (ref 70–99)
Potassium: 3.1 mmol/L — ABNORMAL LOW (ref 3.5–5.1)
Sodium: 140 mmol/L (ref 135–145)
TOTAL PROTEIN: 5.9 g/dL — AB (ref 6.5–8.1)

## 2017-10-27 LAB — CBC WITH DIFFERENTIAL/PLATELET
Abs Immature Granulocytes: 0 10*3/uL (ref 0.0–0.1)
Basophils Absolute: 0.1 10*3/uL (ref 0.0–0.1)
Basophils Relative: 1 %
EOS ABS: 0.1 10*3/uL (ref 0.0–0.7)
EOS PCT: 1 %
HCT: 40.4 % (ref 36.0–46.0)
HEMOGLOBIN: 13.1 g/dL (ref 12.0–15.0)
Immature Granulocytes: 0 %
LYMPHS PCT: 14 %
Lymphs Abs: 1.6 10*3/uL (ref 0.7–4.0)
MCH: 30 pg (ref 26.0–34.0)
MCHC: 32.4 g/dL (ref 30.0–36.0)
MCV: 92.4 fL (ref 78.0–100.0)
MONO ABS: 1 10*3/uL (ref 0.1–1.0)
Monocytes Relative: 9 %
Neutro Abs: 8.1 10*3/uL — ABNORMAL HIGH (ref 1.7–7.7)
Neutrophils Relative %: 75 %
Platelets: 225 10*3/uL (ref 150–400)
RBC: 4.37 MIL/uL (ref 3.87–5.11)
RDW: 12.2 % (ref 11.5–15.5)
WBC: 10.8 10*3/uL — AB (ref 4.0–10.5)

## 2017-10-27 LAB — MRSA PCR SCREENING: MRSA by PCR: NEGATIVE

## 2017-10-27 MED ORDER — HYDRALAZINE HCL 20 MG/ML IJ SOLN: 5.0000 mg | Freq: Four times a day (QID) | INTRAMUSCULAR | Status: DC | PRN

## 2017-10-27 MED ORDER — GERHARDT'S BUTT CREAM
TOPICAL_CREAM | Freq: Three times a day (TID) | CUTANEOUS | Status: DC
  Administered 2017-10-27 – 2017-10-30 (×10): via TOPICAL
  Filled 2017-10-27 (×2): qty 1

## 2017-10-27 MED ORDER — POTASSIUM CHLORIDE 10 MEQ/100ML IV SOLN
10.0000 meq | INTRAVENOUS | Status: AC
  Administered 2017-10-27 (×3): 10 meq via INTRAVENOUS
  Filled 2017-10-27 (×3): qty 100

## 2017-10-27 NOTE — Progress Notes (Signed)
Patient is agitated and will not stay still for a manual blood pressure.

## 2017-10-27 NOTE — Progress Notes (Signed)
MD paged about the patients blood pressure being 141/121.

## 2017-10-27 NOTE — Consult Note (Signed)
WOC Nurse wound consult note Reason for Consult: incontinence (urinary) with UTI. Intertriginous skin damage at gluteal cleft, macerated and cool tissues. Urine output overwhelming external powered collection device (PurWick). Daughter is present for my assessment and is in agreement with the POC.  Together, we see no other areas of skin loss other than the intertriginous area of dermatitis at the gluteal cleft. Wound type:Moisture, specifically incontinence associated dermatitis and intertriginous dermatitis Pressure Injury POA: NA Measurement: 7cm x 0.2cm x 0.1cm Wound UJW:JXBJ moist Drainage (amount, consistency, odor) scant serous Periwound:macerated Dressing procedure/placement/frequency: We will provide Nursing with guidance for topical application of a prescriptive skin barrier in Gerhart's Butt Cream (1:1:1 preparation of zinc oxide, antifungal (lotrimin)and hydrocortisone).  Additionally, we will provide a mattress replacement with low air loss feature for management of microclimate and side to side positioning for maximum exposure to air.   WOC nursing team will not follow, but will remain available to this patient, the nursing and medical teams.  Please re-consult if needed. Thanks, Ladona Mow, MSN, RN, GNP, [facility]  Pager# [phone]

## 2017-10-27 NOTE — Progress Notes (Signed)
PROGRESS NOTE                                                                                                                                                                                                             Patient Demographics:    Brittany Compton, is a 72 y.o. female, DOB - 1945-06-10, GXQ:[id number]  Admit date - 10/26/2017   Admitting Physician Reubin Milan, MD  Outpatient Primary MD for the patient is [facility]  LOS - 1   Chief Complaint  Patient presents with  . Altered Mental Status       Brief Narrative     72 y.o. female with medical history significant of dementia who [facility] who is being sent to the ER due to decreased activity level and lethargy, her work-up significant for sepsis due to UTI.   Subjective:    [name] today altered and cannot provide any complaints .   Assessment  & Plan :    Principal Problem:   Sepsis secondary to UTI [facility]) Active Problems:   Dementia ([place])   Hyperglycemia   Infection of right eye   Sepsis secondary to UTI  -Criteria met on admission, febrile, with leukocytosis and worsening mental status -Work-up significant for UTI, follow urine culture, meanwhile continue empirically with IV cefepime -Continue with IV fluids -Blood cultures.   Dementia ([place]) -No supportive care, -Currently she remains altered, think it safe yet to start any oral intake, so we will keep on IV fluids and n.p.o.  Hypokalemia -Repleted with IV fluids, recheck in a.m.  Right eye swelling -No significant findings on CT orbits, has some swelling between the globes and eyelids, I see no evidence of discharge or infection, but I will keep empirically on eyedrops for now, clear if she had a bruise, will monitor closely.     Code Status : Full  Family Communication  : None at bedside  Disposition Plan  :  SNF once stable  Consults  : None  Procedures  :  None  DVT Prophylaxis  : Heparin  Lab Results  Component Value Date   PLT 225 10/27/2017    Antibiotics  :    Anti-infectives (From admission, onward)   Start     Dose/Rate Route Frequency Ordered Stop   10/27/17 0500  vancomycin (  VANCOCIN) IVPB 750 mg/150 ml premix  Status:  Discontinued     750 mg 150 mL/hr over 60 Minutes Intravenous Every 12 hours 10/26/17 1728 10/26/17 2143   10/27/17 0200  ceFEPIme (MAXIPIME) 2 g in sodium chloride 0.9 % 100 mL IVPB     2 g 200 mL/hr over 30 Minutes Intravenous Every 12 hours 10/26/17 2146     10/27/17 0100  meropenem (MERREM) 1 g in sodium chloride 0.9 % 100 mL IVPB  Status:  Discontinued     1 g 200 mL/hr over 30 Minutes Intravenous Every 8 hours 10/26/17 1728 10/26/17 1730   10/27/17 0100  meropenem (MERREM) 2 g in sodium chloride 0.9 % 100 mL IVPB  Status:  Discontinued     2 g 200 mL/hr over 30 Minutes Intravenous Every 8 hours 10/26/17 1730 10/26/17 2143   10/26/17 1615  vancomycin (VANCOCIN) 1,500 mg in sodium chloride 0.9 % 500 mL IVPB     1,500 mg 250 mL/hr over 120 Minutes Intravenous  Once 10/26/17 1523 10/26/17 1918   10/26/17 1600  ceFEPIme (MAXIPIME) 2 g in sodium chloride 0.9 % 100 mL IVPB  Status:  Discontinued     2 g 200 mL/hr over 30 Minutes Intravenous  Once 10/26/17 1523 10/26/17 1538   10/26/17 1600  meropenem (MERREM) 2 g in sodium chloride 0.9 % 100 mL IVPB     2 g 200 mL/hr over 30 Minutes Intravenous  Once 10/26/17 1548 10/26/17 1713   10/26/17 1545  ampicillin (OMNIPEN) 2 g in sodium chloride 0.9 % 100 mL IVPB  Status:  Discontinued     2 g 300 mL/hr over 20 Minutes Intravenous  Once 10/26/17 1539 10/26/17 1541   10/26/17 1545  cefTRIAXone (ROCEPHIN) 2 g in sodium chloride 0.9 % 100 mL IVPB  Status:  Discontinued     2 g 200 mL/hr over 30 Minutes Intravenous  Once 10/26/17 1539 10/26/17 1544        Objective:   Vitals:   10/26/17 2234 10/27/17 0235 10/27/17 0340 10/27/17 0635  BP: (!) 162/86 (!) 133/56   (!) 115/58  Pulse: (!) 101 82  79  Resp: (!) 28 18  18   Temp: 98.7 F (37.1 C)  98.2 F (36.8 C)   TempSrc:      SpO2: 98% 100%  96%    Wt Readings from Last 3 Encounters:  05/15/16 68.5 kg  03/16/16 68.7 kg  10/25/15 72.6 kg     Intake/Output Summary (Last 24 hours) at 10/27/2017 1222 Last data filed at 10/27/2017 0914 Gross per 24 hour  Intake 3650.64 ml  Output -  Net 3650.64 ml     Physical Exam  Sleeping comfortably, does not follow commands, keep her eyes shut when I have tried to open them, right eye with some mild edema could not appreciate any discharge. Symmetrical Chest wall movement, Good air movement bilaterally, CTAB RRR,No Gallops,Rubs or new Murmurs, No Parasternal Heave +ve B.Sounds, Abd Soft, No tenderness, No rebound - guarding or rigidity. No Cyanosis, Clubbing or edema, No new Rash or bruise      Data Review:    CBC Recent Labs  Lab 10/26/17 1601 10/27/17 0615  WBC 12.2* 10.8*  HGB 14.4 13.1  HCT 45.5 40.4  PLT 303 225  MCV 93.4 92.4  MCH 29.6 30.0  MCHC 31.6 32.4  RDW 12.3 12.2  LYMPHSABS 1.2 1.6  MONOABS 0.5 1.0  EOSABS 0.0 0.1  BASOSABS 0.0 0.1    Chemistries  Recent Labs  Lab 10/26/17 1601 10/26/17 2230 10/27/17 0615  NA 138  --  140  K 3.6  --  3.1*  CL 105  --  107  CO2 25  --  26  GLUCOSE 140*  --  114*  BUN 12  --  7*  CREATININE 0.75  --  0.67  CALCIUM 8.8*  --  8.1*  MG  --  1.9  --   AST 25  --  20  ALT 17  --  15  ALKPHOS 89  --  72  BILITOT 0.7  --  1.0   ------------------------------------------------------------------------------------------------------------------ No results for input(s): CHOL, HDL, LDLCALC, TRIG, CHOLHDL, LDLDIRECT in the last 72 hours.  No results found for: HGBA1C ------------------------------------------------------------------------------------------------------------------ No results for input(s): TSH, T4TOTAL, T3FREE, THYROIDAB in the last 72 hours.  Invalid input(s):  FREET3 ------------------------------------------------------------------------------------------------------------------ No results for input(s): VITAMINB12, FOLATE, FERRITIN, TIBC, IRON, RETICCTPCT in the last 72 hours.  Coagulation profile No results for input(s): INR, PROTIME in the last 168 hours.  No results for input(s): DDIMER in the last 72 hours.  Cardiac Enzymes No results for input(s): CKMB, TROPONINI, MYOGLOBIN in the last 168 hours.  Invalid input(s): CK ------------------------------------------------------------------------------------------------------------------ No results found for: BNP  Inpatient Medications  Scheduled Meds: . Gerhardt's butt cream   Topical TID  . heparin  5,000 Units Subcutaneous Q8H  . ofloxacin  1 drop Right Eye QID   Continuous Infusions: . sodium chloride 125 mL/hr at 10/27/17 0910  . ceFEPime (MAXIPIME) IV 2 g (10/27/17 0248)  . potassium chloride 10 mEq (10/27/17 1133)   PRN Meds:.acetaminophen **OR** acetaminophen, ondansetron **OR** ondansetron (ZOFRAN) IV  Micro Results Recent Results (from the past 240 hour(s))  MRSA PCR Screening     Status: None   Collection Time: 10/26/17 10:37 PM  Result Value Ref Range Status   MRSA by PCR NEGATIVE NEGATIVE Final    Comment:        The GeneXpert MRSA Assay (FDA approved for NASAL specimens only), is one component of a comprehensive MRSA colonization surveillance program. It is not intended to diagnose MRSA infection nor to guide or monitor treatment for MRSA infections. Performed at [facility], [place] [address], [place], [place] [address]     Radiology Reports Dg Chest 2 View  Result Date: 10/26/2017 CLINICAL DATA:  Pt presents for evaluation of decreased activity and lethargy. Lives at [facility], is nonverbal and wheelchair bound at baseline. Facility states LKW last night. Reports drooling to L mouth, leaning more towards L side. Facilit.*comment was truncated* EXAM:  CHEST - 2 VIEW COMPARISON:  Radiograph 11/06/2016 FINDINGS: Normal cardiac silhouette. Low lung volumes. Mild bronchitic change. No effusion, infiltrate pneumothorax. Degenerate spurring of spine. IMPRESSION: Mild bronchitic change.  No acute findings.  Low lung volumes. Electronically Signed   By: Suzy Bouchard M.D.   On: 10/26/2017 16:39   Ct Head Wo Contrast  Result Date: 10/26/2017 CLINICAL DATA:  Altered mental status. EXAM: CT HEAD WITHOUT CONTRAST CT ORBITS WITH CONTRAST TECHNIQUE: Contiguous axial images were obtained from the base of the skull through the vertex without contrast. Multidetector CT imaging of the orbits was performed using the standard protocol with intravenous contrast. CONTRAST:  36m OMNIPAQUE IOHEXOL 300 MG/ML  SOLN COMPARISON:  Head CT 07/02/2017. FINDINGS: CT HEAD FINDINGS Brain: There is no mass, hemorrhage or extra-axial collection. There is generalized brain atrophy greater than expected at this age. There is no acute or chronic infarction. There is hypoattenuation of the  periventricular white matter, most commonly indicating chronic ischemic microangiopathy. Vascular: No abnormal hyperdensity of the major intracranial arteries or dural venous sinuses. No intracranial atherosclerosis. Skull: The visualized skull base, calvarium and extracranial soft tissues are normal. CT ORBITS FINDINGS The orbits examination is severely motion degraded. Orbits: --Globes: Normal. --Bony orbit: Normal. --Preseptal soft tissues: There is a small fluid collection just anterior to the right globe. This may be fluid trapped beneath the eyelid. There is mild right eyelid edema. --Intra- and extraconal orbital fat: Normal. No inflammatory stranding. --Optic nerves: Normal. --Lacrimal glands and fossae: Normal. --Extraocular muscles: Normal. Visualized sinuses:  No fluid levels or advanced mucosal thickening. Soft tissues: Normal. IMPRESSION: 1. No acute intracranial abnormality. 2. 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 3. Severely motion degraded orbital examination. 4. Mild edema of the right eyelid with small underlying fluid collection between the eyelid and the globe. This may be simple fluid trapped beneath the eyelid. Superficial inflammatory processes such as chalazion are also possibilities. An abscess within the eyelid is less likely. Direct visualization is recommended. 5. No evidence for orbital (postseptal) cellulitis. Electronically Signed   By: Ulyses Jarred M.D.   On: 10/26/2017 18:36   Ct Orbits W Contrast  Result Date: 10/26/2017 CLINICAL DATA:  Altered mental status. EXAM: CT HEAD WITHOUT CONTRAST CT ORBITS WITH CONTRAST TECHNIQUE: Contiguous axial images were obtained from the base of the skull through the vertex without contrast. Multidetector CT imaging of the orbits was performed using the standard protocol with intravenous contrast. CONTRAST:  78m OMNIPAQUE IOHEXOL 300 MG/ML  SOLN COMPARISON:  Head CT 07/02/2017. FINDINGS: CT HEAD FINDINGS Brain: There is no mass, hemorrhage or extra-axial collection. There is generalized brain atrophy greater than expected at this age. There is no acute or chronic infarction. There is hypoattenuation of the periventricular white matter, most commonly indicating chronic ischemic microangiopathy. Vascular: No abnormal hyperdensity of the major intracranial arteries or dural venous sinuses. No intracranial atherosclerosis. Skull: The visualized skull base, calvarium and extracranial soft tissues are normal. CT ORBITS FINDINGS The orbits examination is severely motion degraded. Orbits: --Globes: Normal. --Bony orbit: Normal. --Preseptal soft tissues: There is a small fluid collection just anterior to the right globe. This may be fluid trapped beneath the eyelid. There is mild right eyelid edema. --Intra- and extraconal orbital fat: Normal. No inflammatory stranding. --Optic nerves: Normal. --Lacrimal glands and fossae:  Normal. --Extraocular muscles: Normal. Visualized sinuses:  No fluid levels or advanced mucosal thickening. Soft tissues: Normal. IMPRESSION: 1. No acute intracranial abnormality. 2. 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 3. Severely motion degraded orbital examination. 4. Mild edema of the right eyelid with small underlying fluid collection between the eyelid and the globe. This may be simple fluid trapped beneath the eyelid. Superficial inflammatory processes such as chalazion are also possibilities. An abscess within the eyelid is less likely. Direct visualization is recommended. 5. No evidence for orbital (postseptal) cellulitis. Electronically Signed   By: KUlyses JarredM.D.   On: 10/26/2017 18:36      DPhillips ClimesM.D on 10/27/2017 at 12:22 PM  Between 7am to 7pm - Pager - [phone] After 7pm go to [email or web address] - password [email or web address] -  Office  [phone]

## 2017-10-27 NOTE — Progress Notes (Signed)
Wound care done twice.

## 2017-10-28 LAB — CBC WITH DIFFERENTIAL/PLATELET
ABS IMMATURE GRANULOCYTES: 0 10*3/uL (ref 0.0–0.1)
Basophils Absolute: 0.1 10*3/uL (ref 0.0–0.1)
Basophils Relative: 1 %
EOS ABS: 0.1 10*3/uL (ref 0.0–0.7)
EOS PCT: 1 %
HEMATOCRIT: 41.7 % (ref 36.0–46.0)
HEMOGLOBIN: 13.3 g/dL (ref 12.0–15.0)
IMMATURE GRANULOCYTES: 0 %
LYMPHS ABS: 1.7 10*3/uL (ref 0.7–4.0)
Lymphocytes Relative: 16 %
MCH: 29.4 pg (ref 26.0–34.0)
MCHC: 31.9 g/dL (ref 30.0–36.0)
MCV: 92.1 fL (ref 78.0–100.0)
MONOS PCT: 9 %
Monocytes Absolute: 1 10*3/uL (ref 0.1–1.0)
NEUTROS PCT: 73 %
Neutro Abs: 7.8 10*3/uL — ABNORMAL HIGH (ref 1.7–7.7)
Platelets: 263 10*3/uL (ref 150–400)
RBC: 4.53 MIL/uL (ref 3.87–5.11)
RDW: 12.3 % (ref 11.5–15.5)
WBC: 10.6 10*3/uL — ABNORMAL HIGH (ref 4.0–10.5)

## 2017-10-28 LAB — BASIC METABOLIC PANEL
ANION GAP: 7 (ref 5–15)
BUN: 9 mg/dL (ref 8–23)
CHLORIDE: 105 mmol/L (ref 98–111)
CO2: 27 mmol/L (ref 22–32)
Calcium: 8.5 mg/dL — ABNORMAL LOW (ref 8.9–10.3)
Creatinine, Ser: 0.66 mg/dL (ref 0.44–1.00)
GFR calc Af Amer: >60 mL/min (ref >60)
GLUCOSE: 119 mg/dL — AB (ref 70–99)
POTASSIUM: 3.1 mmol/L — AB (ref 3.5–5.1)
Sodium: 139 mmol/L (ref 135–145)

## 2017-10-28 MED ORDER — POTASSIUM CHLORIDE CRYS ER 20 MEQ PO TBCR
40.0000 meq | EXTENDED_RELEASE_TABLET | ORAL | Status: AC
  Administered 2017-10-28: 40 meq via ORAL
  Filled 2017-10-28: qty 2

## 2017-10-28 MED ORDER — ENSURE ENLIVE PO LIQD
237.0000 mL | Freq: Two times a day (BID) | ORAL | Status: DC
  Administered 2017-10-28 – 2017-10-30 (×5): 237 mL via ORAL

## 2017-10-28 NOTE — Evaluation (Signed)
Clinical/Bedside Swallow Evaluation Patient Details  Name: Brittany Compton MRN: [id number] Date of Birth: 07-Jun-1945  Today's Date: 10/28/2017 Time: SLP Start Time (ACUTE ONLY): 1043 SLP Stop Time (ACUTE ONLY): 1057 SLP Time Calculation (min) (ACUTE ONLY): 14 min  Past Medical History:  Past Medical History:  Diagnosis Date  . Dementia    Past Surgical History:  Past Surgical History:  Procedure Laterality Date  . GALLBLADDER SURGERY     HPI:  Pt is a72 y.o.femalewho [facility] and [facility]y. Work-up significant for sepsis due to UTI. CT Head and CXR both negative for acute findings. PMH: dementia   Assessment / Plan / Recommendation Clinical Impression  Pt shows signs of a cognitively-based dysphagia, which seems to be primarily oral based on clinical presentation. She has reduced awareness for bolus acceptance, although with increased automaticity once bolus is in her oral cavity. Given mild amount of extra time, she clears her mouth well. She does not show overt s/s of aspiration and her CXR is clear, although her baseline diet is not known. Recommend starting Dys 2 diet and thin liquids with full supervision during meals. SLP will f/u for tolerace. SLP Visit Diagnosis: Dysphagia, oral phase (R13.11)    Aspiration Risk  Mild aspiration risk    Diet Recommendation Dysphagia 2 (Fine chop);Thin liquid   Liquid Administration via: Cup;Straw Medication Administration: Crushed with puree Supervision: Staff to assist with self feeding;Full supervision/cueing for compensatory strategies Compensations: Slow rate;Small sips/bites;Minimize environmental distractions Postural Changes: Seated upright at 90 degrees    Other  Recommendations Oral Care Recommendations: Oral care BID   Follow up Recommendations Skilled Nursing facility      Frequency and Duration min 2x/week  2 weeks       Prognosis Prognosis for Safe Diet Advancement:  Fair Barriers to Reach Goals: Cognitive deficits      Swallow Study   General HPI: Pt is a72 y.o.femalewho [facility] and [facility]argy. Work-up significant for sepsis due to UTI. CT Head and CXR both negative for acute findings. PMH: dementia Type of Study: Bedside Swallow Evaluation Previous Swallow Assessment: none in chart Diet Prior to this Study: NPO Temperature Spikes Noted: No Respiratory Status: Nasal cannula History of Recent Intubation: No Behavior/Cognition: Alert;Cooperative;Doesn't follow directions Oral Cavity Assessment: Within Functional Limits(as could be visualized) Oral Care Completed by SLP: No Oral Cavity - Dentition: Adequate natural dentition Self-Feeding Abilities: Total assist Patient Positioning: Upright in bed Baseline Vocal Quality: Normal Volitional Cough: Cognitively unable to elicit Volitional Swallow: Unable to elicit    Oral/Motor/Sensory Function Overall Oral Motor/Sensory Function: (R droop, noted in chart at least one year ago)   [place] chips: Within functional limits Presentation: Spoon   Thin Liquid Thin Liquid: Impaired Presentation: Cup;Straw Oral Phase Impairments: Poor awareness of bolus    Nectar Thick Nectar Thick Liquid: Not tested   Honey Thick Honey Thick Liquid: Not tested   Puree Puree: Impaired Presentation: Spoon Oral Phase Impairments: Poor awareness of bolus   Solid     Solid: Impaired Oral Phase Impairments: Poor awareness of bolus      Maxcine Ham 10/28/2017,11:21 AM   Maxcine Ham, M.A. CCC-SLP Acute Herbalist [phone] Office [phone]

## 2017-10-28 NOTE — NC FL2 (Signed)
[place] MEDICAID FL2 LEVEL OF CARE SCREENING TOOL     IDENTIFICATION  Patient Name: Brittany Compton Birthdate: 1945-04-25 Sex: female Admission Date (Current Location): 10/26/2017  [facility] and [place] Number:  Producer, television/film/video and Address:  The [place], [address], [place], [place] [address]      Provider Number: [id number]  Attending Physician Name and Address:  Starleen Arms, MD  Relative Name and Phone Number:  Reymundo Poll, daughter, [phone]    Current Level of Care: Hospital Recommended Level of Care: Skilled Nursing Facility Prior Approval Number:    Date Approved/Denied:   PASRR Number: [id number]  Discharge Plan: SNF    Current Diagnoses: Patient Active Problem List   Diagnosis Date Noted  . Pressure injury of skin 10/27/2017  . Sepsis secondary to UTI (HCC) 10/26/2017  . Dementia (HCC) 10/26/2017  . Hyperglycemia 10/26/2017  . Infection of right eye 10/26/2017    Orientation RESPIRATION BLADDER Height & Weight     (Disoriented x4)  Normal Incontinent Weight:   Height:     BEHAVIORAL SYMPTOMS/MOOD NEUROLOGICAL BOWEL NUTRITION STATUS  (Dementia but no wandering behaviors)   Continent Diet(Please see DC Summary)  AMBULATORY STATUS COMMUNICATION OF NEEDS Skin   Extensive Assist Verbally PU Stage and Appropriate Care(Stage II on buttocks)                       Personal Care Assistance Level of Assistance  Bathing, Feeding, Dressing Bathing Assistance: Maximum assistance Feeding assistance: Maximum assistance Dressing Assistance: Maximum assistance     Functional Limitations Info  Sight, Hearing, Speech Sight Info: Adequate Hearing Info: Adequate Speech Info: Adequate    SPECIAL CARE FACTORS FREQUENCY  PT (By licensed PT), OT (By licensed OT)     PT Frequency: 5x/week OT Frequency: 3x/week            Contractures Contractures Info: Not present    Additional Factors Info  Code Status,  Allergies Code Status Info: Full Allergies Info: Penicillins           Current Medications (10/28/2017):  This is the current hospital active medication list Current Facility-Administered Medications  Medication Dose Route Frequency Provider Last Rate Last Dose  . 0.9 %  sodium chloride infusion   Intravenous Continuous Elgergawy, Leana Roe, MD 75 mL/hr at 10/28/17 1556    . acetaminophen (TYLENOL) tablet 650 mg  650 mg Oral Q6H PRN Bobette Mo, MD       Or  . acetaminophen (TYLENOL) suppository 650 mg  650 mg Rectal Q6H PRN Bobette Mo, MD      . ceFEPIme (MAXIPIME) 2 g in sodium chloride 0.9 % 100 mL IVPB  2 g Intravenous Q12H Ann Held, RPH 200 mL/hr at 10/28/17 1558 2 g at 10/28/17 1558  . feeding supplement (ENSURE ENLIVE) (ENSURE ENLIVE) liquid 237 mL  237 mL Oral BID BM Elgergawy, Leana Roe, MD   237 mL at 10/28/17 1600  . Gerhardt's butt cream   Topical TID Elgergawy, Leana Roe, MD      . heparin injection 5,000 Units  5,000 Units Subcutaneous Q8H Bobette Mo, MD   5,000 Units at 10/28/17 1316  . hydrALAZINE (APRESOLINE) injection 5 mg  5 mg Intravenous Q6H PRN Elgergawy, Leana Roe, MD      . ofloxacin (OCUFLOX) 0.3 % ophthalmic solution 1 drop  1 drop Right Eye QID Bobette Mo, MD   1 drop at 10/28/17 1316  .  ondansetron (ZOFRAN) tablet 4 mg  4 mg Oral Q6H PRN Bobette Mo, MD       Or  . ondansetron [facility]) injection 4 mg  4 mg Intravenous Q6H PRN Bobette Mo, MD         Discharge Medications: Please see discharge summary for a list of discharge medications.  Relevant Imaging Results:  Relevant Lab Results:   Additional Information SSN: [id number] [place], [place]

## 2017-10-28 NOTE — Progress Notes (Signed)
PROGRESS NOTE                                                                                                                                                                                                             Patient Demographics:    Brittany Compton, is a 72 y.o. female, DOB - Sep 08, 1945, TOI:[id number]  Admit date - 10/26/2017   Admitting Physician Reubin Milan, MD  Outpatient Primary MD for the patient is [facility]  LOS - 2   Chief Complaint  Patient presents with  . Altered Mental Status       Brief Narrative     72 y.o. female with medical history significant of dementia who is [facility] who is being sent to the ER due to decreased activity level and lethargy, her work-up significant for sepsis due to UTI.   Subjective:    Brittany Compton today no significant events overnight   Assessment  & Plan :    Principal Problem:   Sepsis secondary to UTI [facility]) Active Problems:   Dementia ([place])   Hyperglycemia   Infection of right eye   Sepsis secondary to UTI  -Sepsis criteria met on admission, febrile, with leukocytosis and worsening mental status -Work-up significant for UTI, urine culture growing procidentia, awaiting sensitivities, for now continue empirically with IV cefepime neuro once sensitivities are known, continue with IV fluids . -Culture remains with no growth to date   Dementia ([place]) -No supportive care, -I have discussed with daughter at bedside, she was slightly altered when she came, more lethargic than baseline, but she is total assist at baseline, nonverbal, apparently she is now back to baseline . -Seen by SLP, started on a diet .  Hypokalemia -Low again today, repleting, recheck in a.m.  Right eye swelling -No significant findings on CT orbits, has some swelling between the globes and eyelids, I see no evidence of discharge or infection, but I will keep empirically on  eyedrops for now, clear if she had a bruise, will monitor closely.     Code Status : Full  Family Communication  : Daughter at bedside  Disposition Plan  :  SNF once stable  Consults  : None  Procedures  : None  DVT Prophylaxis  : Heparin  Lab Results  Component Value Date   PLT  263 10/28/2017    Antibiotics  :    Anti-infectives (From admission, onward)   Start     Dose/Rate Route Frequency Ordered Stop   10/27/17 0500  vancomycin (VANCOCIN) IVPB 750 mg/150 ml premix  Status:  Discontinued     750 mg 150 mL/hr over 60 Minutes Intravenous Every 12 hours 10/26/17 1728 10/26/17 2143   10/27/17 0200  ceFEPIme (MAXIPIME) 2 g in sodium chloride 0.9 % 100 mL IVPB     2 g 200 mL/hr over 30 Minutes Intravenous Every 12 hours 10/26/17 2146     10/27/17 0100  meropenem (MERREM) 1 g in sodium chloride 0.9 % 100 mL IVPB  Status:  Discontinued     1 g 200 mL/hr over 30 Minutes Intravenous Every 8 hours 10/26/17 1728 10/26/17 1730   10/27/17 0100  meropenem (MERREM) 2 g in sodium chloride 0.9 % 100 mL IVPB  Status:  Discontinued     2 g 200 mL/hr over 30 Minutes Intravenous Every 8 hours 10/26/17 1730 10/26/17 2143   10/26/17 1615  vancomycin (VANCOCIN) 1,500 mg in sodium chloride 0.9 % 500 mL IVPB     1,500 mg 250 mL/hr over 120 Minutes Intravenous  Once 10/26/17 1523 10/26/17 1918   10/26/17 1600  ceFEPIme (MAXIPIME) 2 g in sodium chloride 0.9 % 100 mL IVPB  Status:  Discontinued     2 g 200 mL/hr over 30 Minutes Intravenous  Once 10/26/17 1523 10/26/17 1538   10/26/17 1600  meropenem (MERREM) 2 g in sodium chloride 0.9 % 100 mL IVPB     2 g 200 mL/hr over 30 Minutes Intravenous  Once 10/26/17 1548 10/26/17 1713   10/26/17 1545  ampicillin (OMNIPEN) 2 g in sodium chloride 0.9 % 100 mL IVPB  Status:  Discontinued     2 g 300 mL/hr over 20 Minutes Intravenous  Once 10/26/17 1539 10/26/17 1541   10/26/17 1545  cefTRIAXone (ROCEPHIN) 2 g in sodium chloride 0.9 % 100 mL IVPB   Status:  Discontinued     2 g 200 mL/hr over 30 Minutes Intravenous  Once 10/26/17 1539 10/26/17 1544        Objective:   Vitals:   10/28/17 0315 10/28/17 0526 10/28/17 0811 10/28/17 1240  BP: (!) 152/97  (!) 133/107 (!) 141/77  Pulse:   100 85  Resp: 15  (!) 24 18  Temp:  98.2 F (36.8 C) 98.3 F (36.8 C) 99 F (37.2 C)  TempSrc:  Axillary Axillary   SpO2:   98% 100%    Wt Readings from Last 3 Encounters:  05/15/16 68.5 kg  03/16/16 68.7 kg  10/25/15 72.6 kg     Intake/Output Summary (Last 24 hours) at 10/28/2017 1515 Last data filed at 10/28/2017 1400 Gross per 24 hour  Intake 1100 ml  Output 1850 ml  Net -750 ml     Physical Exam  Awake, nonverbal, does not follow commands, eye swelling has significantly improved, still having significant subconjunctival edema , right pupil is nonreactive to light, mildly opaque, I have discussed with daughter at bedside, this is her baseline  Symmetrical Chest wall movement, Good air movement bilaterally, CTAB RRR,No Gallops,Rubs or new Murmurs, No Parasternal Heave +ve B.Sounds, Abd Soft, No tenderness, No rebound - guarding or rigidity. No Cyanosis, Clubbing or edema, No new Rash or bruise       Data Review:    CBC Recent Labs  Lab 10/26/17 1601 10/27/17 0615 10/28/17 0527  WBC 12.2* 10.8*  10.6*  HGB 14.4 13.1 13.3  HCT 45.5 40.4 41.7  PLT 303 225 263  MCV 93.4 92.4 92.1  MCH 29.6 30.0 29.4  MCHC 31.6 32.4 31.9  RDW 12.3 12.2 12.3  LYMPHSABS 1.2 1.6 1.7  MONOABS 0.5 1.0 1.0  EOSABS 0.0 0.1 0.1  BASOSABS 0.0 0.1 0.1    Chemistries  Recent Labs  Lab 10/26/17 1601 10/26/17 2230 10/27/17 0615 10/28/17 0527  NA 138  --  140 139  K 3.6  --  3.1* 3.1*  CL 105  --  107 105  CO2 25  --  26 27  GLUCOSE 140*  --  114* 119*  BUN 12  --  7* 9  CREATININE 0.75  --  0.67 0.66  CALCIUM 8.8*  --  8.1* 8.5*  MG  --  1.9  --   --   AST 25  --  20  --   ALT 17  --  15  --   ALKPHOS 89  --  72  --   BILITOT 0.7   --  1.0  --    ------------------------------------------------------------------------------------------------------------------ No results for input(s): CHOL, HDL, LDLCALC, TRIG, CHOLHDL, LDLDIRECT in the last 72 hours.  No results found for: HGBA1C ------------------------------------------------------------------------------------------------------------------ No results for input(s): TSH, T4TOTAL, T3FREE, THYROIDAB in the last 72 hours.  Invalid input(s): FREET3 ------------------------------------------------------------------------------------------------------------------ No results for input(s): VITAMINB12, FOLATE, FERRITIN, TIBC, IRON, RETICCTPCT in the last 72 hours.  Coagulation profile No results for input(s): INR, PROTIME in the last 168 hours.  No results for input(s): DDIMER in the last 72 hours.  Cardiac Enzymes No results for input(s): CKMB, TROPONINI, MYOGLOBIN in the last 168 hours.  Invalid input(s): CK ------------------------------------------------------------------------------------------------------------------ No results found for: BNP  Inpatient Medications  Scheduled Meds: . feeding supplement (ENSURE ENLIVE)  237 mL Oral BID BM  . Gerhardt's butt cream   Topical TID  . heparin  5,000 Units Subcutaneous Q8H  . ofloxacin  1 drop Right Eye QID  . potassium chloride  40 mEq Oral Q4H   Continuous Infusions: . sodium chloride 75 mL/hr at 10/28/17 0820  . ceFEPime (MAXIPIME) IV 2 g (10/28/17 0113)   PRN Meds:.acetaminophen **OR** acetaminophen, hydrALAZINE, ondansetron **OR** ondansetron (ZOFRAN) IV  Micro Results Recent Results (from the past 240 hour(s))  Blood Culture (routine x 2)     Status: None (Preliminary result)   Collection Time: 10/26/17  4:01 PM  Result Value Ref Range Status   Specimen Description BLOOD RIGHT HAND  Final   Special Requests   Final    BOTTLES DRAWN AEROBIC AND ANAEROBIC Blood Culture results may not be optimal due to  an inadequate volume of blood received in culture bottles   Culture   Final    NO GROWTH 2 DAYS Performed at [facility], [place] [address], [place], [place] [address]    Report Status PENDING  Incomplete  Blood Culture (routine x 2)     Status: None (Preliminary result)   Collection Time: 10/26/17  4:02 PM  Result Value Ref Range Status   Specimen Description BLOOD LEFT HAND  Final   Special Requests   Final    BOTTLES DRAWN AEROBIC AND ANAEROBIC Blood Culture adequate volume   Culture   Final    NO GROWTH 2 DAYS Performed at [facility], [place] [address] [place],  [address]    Report Status PENDING  Incomplete  Urine culture     Status: Abnormal (Preliminary result)  Collection Time: 10/26/17  4:28 PM  Result Value Ref Range Status   Specimen Description URINE, CATHETERIZED  Final   Special Requests   Final    NONE Performed at [facility], [address], [place], [place] [address]    Culture >=100,000 COLONIES/mL PROVIDENCIA STUARTII (A)  Final   Report Status PENDING  Incomplete  MRSA PCR Screening     Status: None   Collection Time: 10/26/17 10:37 PM  Result Value Ref Range Status   MRSA by PCR NEGATIVE NEGATIVE Final    Comment:        The GeneXpert MRSA Assay (FDA approved for NASAL specimens only), is one component of a comprehensive MRSA colonization surveillance program. It is not intended to diagnose MRSA infection nor to guide or monitor treatment for MRSA infections. Performed at [facility], [place] [address], [place], [place] [address]     Radiology Reports Dg Chest 2 View  Result Date: 10/26/2017 CLINICAL DATA:  Pt presents for evaluation of decreased activity and lethargy. Lives at [facility], is nonverbal and wheelchair bound at baseline. Facility states LKW last night. Reports drooling to L mouth, leaning more towards L side. Facilit.*comment was truncated* EXAM: CHEST - 2 VIEW COMPARISON:  Radiograph 11/06/2016  FINDINGS: Normal cardiac silhouette. Low lung volumes. Mild bronchitic change. No effusion, infiltrate pneumothorax. Degenerate spurring of spine. IMPRESSION: Mild bronchitic change.  No acute findings.  Low lung volumes. Electronically Signed   By: Suzy Bouchard M.D.   On: 10/26/2017 16:39   Ct Head Wo Contrast  Result Date: 10/26/2017 CLINICAL DATA:  Altered mental status. EXAM: CT HEAD WITHOUT CONTRAST CT ORBITS WITH CONTRAST TECHNIQUE: Contiguous axial images were obtained from the base of the skull through the vertex without contrast. Multidetector CT imaging of the orbits was performed using the standard protocol with intravenous contrast. CONTRAST:  50m OMNIPAQUE IOHEXOL 300 MG/ML  SOLN COMPARISON:  Head CT 07/02/2017. FINDINGS: CT HEAD FINDINGS Brain: There is no mass, hemorrhage or extra-axial collection. There is generalized brain atrophy greater than expected at this age. There is no acute or chronic infarction. There is hypoattenuation of the periventricular white matter, most commonly indicating chronic ischemic microangiopathy. Vascular: No abnormal hyperdensity of the major intracranial arteries or dural venous sinuses. No intracranial atherosclerosis. Skull: The visualized skull base, calvarium and extracranial soft tissues are normal. CT ORBITS FINDINGS The orbits examination is severely motion degraded. Orbits: --Globes: Normal. --Bony orbit: Normal. --Preseptal soft tissues: There is a small fluid collection just anterior to the right globe. This may be fluid trapped beneath the eyelid. There is mild right eyelid edema. --Intra- and extraconal orbital fat: Normal. No inflammatory stranding. --Optic nerves: Normal. --Lacrimal glands and fossae: Normal. --Extraocular muscles: Normal. Visualized sinuses:  No fluid levels or advanced mucosal thickening. Soft tissues: Normal. IMPRESSION: 1. No acute intracranial abnormality. 2. 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  3. Severely motion degraded orbital examination. 4. Mild edema of the right eyelid with small underlying fluid collection between the eyelid and the globe. This may be simple fluid trapped beneath the eyelid. Superficial inflammatory processes such as chalazion are also possibilities. An abscess within the eyelid is less likely. Direct visualization is recommended. 5. No evidence for orbital (postseptal) cellulitis. Electronically Signed   By: KUlyses JarredM.D.   On: 10/26/2017 18:36   Ct Orbits W Contrast  Result Date: 10/26/2017 CLINICAL DATA:  Altered mental status. EXAM: CT HEAD WITHOUT CONTRAST CT ORBITS WITH CONTRAST TECHNIQUE:  Contiguous axial images were obtained from the base of the skull through the vertex without contrast. Multidetector CT imaging of the orbits was performed using the standard protocol with intravenous contrast. CONTRAST:  9m OMNIPAQUE IOHEXOL 300 MG/ML  SOLN COMPARISON:  Head CT 07/02/2017. FINDINGS: CT HEAD FINDINGS Brain: There is no mass, hemorrhage or extra-axial collection. There is generalized brain atrophy greater than expected at this age. There is no acute or chronic infarction. There is hypoattenuation of the periventricular white matter, most commonly indicating chronic ischemic microangiopathy. Vascular: No abnormal hyperdensity of the major intracranial arteries or dural venous sinuses. No intracranial atherosclerosis. Skull: The visualized skull base, calvarium and extracranial soft tissues are normal. CT ORBITS FINDINGS The orbits examination is severely motion degraded. Orbits: --Globes: Normal. --Bony orbit: Normal. --Preseptal soft tissues: There is a small fluid collection just anterior to the right globe. This may be fluid trapped beneath the eyelid. There is mild right eyelid edema. --Intra- and extraconal orbital fat: Normal. No inflammatory stranding. --Optic nerves: Normal. --Lacrimal glands and fossae: Normal. --Extraocular muscles: Normal. Visualized  sinuses:  No fluid levels or advanced mucosal thickening. Soft tissues: Normal. IMPRESSION: 1. No acute intracranial abnormality. 2. 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 3. Severely motion degraded orbital examination. 4. Mild edema of the right eyelid with small underlying fluid collection between the eyelid and the globe. This may be simple fluid trapped beneath the eyelid. Superficial inflammatory processes such as chalazion are also possibilities. An abscess within the eyelid is less likely. Direct visualization is recommended. 5. No evidence for orbital (postseptal) cellulitis. Electronically Signed   By: KUlyses JarredM.D.   On: 10/26/2017 18:36      DPhillips ClimesM.D on 10/28/2017 at 3:15 PM  Between 7am to 7pm - Pager - [phone] After 7pm go to [email or web address] - password [email or web address] -  Office  [phone]

## 2017-10-28 NOTE — Clinical Social Work Note (Signed)
Clinical Social Work Assessment  Patient Details  Name: Brittany Compton MRN: [id number] Date of Birth: 1945/05/14  Date of referral:  10/28/17               Reason for consult:  Facility Placement                Permission sought to share information with:  Facility Medical sales representative, Family Supports Permission granted to share information::  No  Name::     Dance movement psychotherapist::  SNFs  Relationship::  Daughter  Contact Information:  [phone]  Housing/Transportation Living arrangements for the past 2 months:  Assisted Living Facility Source of Information:  Adult Children Patient Interpreter Needed:  None Criminal Activity/Legal Involvement Pertinent to Current Situation/Hospitalization:  No - Comment as needed Significant Relationships:  Adult Children Lives with:  Facility Resident Do you feel safe going back to the place where you live?  No Need for family participation in patient care:  Yes (Comment)  Care giving concerns:  CSW received consult for possible SNF placement at time of discharge. CSW spoke with patient's daughter at bedside regarding PT recommendation of SNF placement at time of discharge. Patient's daughter reported that patient resides at [facility], but she is not certain they can meet her mother's needs at discharge. She expressed understanding of PT recommendation and is agreeable to SNF placement at time of discharge. CSW to continue to follow and assist with discharge planning needs.   Social Worker assessment / plan:  CSW spoke with patient's daughter concerning possibility of rehab at [facility] before returning home.  Employment status:  Retired Database administrator PT Recommendations:  Skilled Nursing Facility Information / Referral to community resources:  Skilled Nursing Facility  Patient/Family's Response to care:  Patient's daughter recognizes need for rehab before returning home and is agreeable to a SNF in [place].  Patient reported preference for [facility].  Patient/Family's Understanding of and Emotional Response to Diagnosis, Current Treatment, and Prognosis:  Patient/family is realistic regarding therapy needs and expressed being hopeful for SNF placement. Patient's daughter expressed understanding of CSW role and discharge process as well as medical condition. No questions/concerns about plan or treatment.    Emotional Assessment Appearance:  Appears stated age Attitude/Demeanor/Rapport:  Unable to Assess Affect (typically observed):  Unable to Assess Orientation:  (Disoriented) Alcohol / Substance use:  Not Applicable Psych involvement (Current and /or in the community):  No (Comment)  Discharge Needs  Concerns to be addressed:  Care Coordination Readmission within the last 30 days:  No Current discharge risk:  None Barriers to Discharge:  Continued Medical Work up   [employer], LCSW 10/28/2017, 4:51 PM

## 2017-10-28 NOTE — Evaluation (Signed)
Physical Therapy Evaluation Patient Details Name: Brittany Compton MRN: [id number] DOB: 05/03/1945 Today's Date: 10/28/2017   History of Present Illness  72yo female sent to the ED from [facility] facility due to reduced activity/increased lethargy. Her baseline is non-verbal and WC bound, but she is very active in her WC. Diagnosed with Sepsis secondary to UTI.  PMH dementia   Clinical Impression   Patient received in bed, lethargic but wakens to verbal and tactile stimulation; non-verbal at baseline and states random or unintelligible words with this therapist during session. Unable to perform simple command following today due to ongoing confusion. Attempted functional bed mobility with totalA, patient with very little attempt to assist therapist or initiate movement, will require +2 for safe further progression of mobility. She was left in bed with all needs met, bed alarm activated this afternoon. She may benefit from trial of skilled PT services in the acute setting as well as return to SNF for further skilled care ([facility] facility if they are able to provide appropriate level of care).     Follow Up Recommendations SNF;Other (comment)(return to [facility] if they are able to provide appropriate level of care and rehab )    Equipment Recommendations  Other (comment)(defer to next venue )    Recommendations for Other Services       Precautions / Restrictions Precautions Precautions: Fall;Other (comment) Precaution Comments: non-verbal and non-ambulatory at baseline  Restrictions Weight Bearing Restrictions: No      Mobility  Bed Mobility Overal bed mobility: Needs Assistance Bed Mobility: Rolling Rolling: Total assist         General bed mobility comments: total A for rolling R/L with very little assistance or initiation noted by patient   Transfers                 General transfer comment: DNT, will require +2 assist for safety   Ambulation/Gait              General Gait Details: non-ambulatory at baseline   Stairs            Wheelchair Mobility    Modified Rankin (Stroke Patients Only)       Balance                                             Pertinent Vitals/Pain Pain Assessment: Faces Pain Score: 0-No pain Faces Pain Scale: No hurt Pain Intervention(s): Limited activity within patient's tolerance;Monitored during session    Home Living Family/patient expects to be discharged to:: Skilled nursing facility                 Additional Comments: lives in [facility] facility; family not present to provide further details regarding PLOF/equipment     Prior Function           Comments: family not present to provide further details regarding PLOF/equipment      Hand Dominance        Extremity/Trunk Assessment        Lower Extremity Assessment Lower Extremity Assessment: Overall WFL for tasks assessed    Cervical / Trunk Assessment Cervical / Trunk Assessment: Kyphotic  Communication   Communication: Expressive difficulties;Other (comment)(non-verbal at baseline )  Cognition Arousal/Alertness: Lethargic Behavior During Therapy: Flat affect;Restless Overall Cognitive Status: History of cognitive impairments - at baseline  General Comments: history of dementia at baseline; initially lethargic, but once roused by PT became mildly restless       General Comments General comments (skin integrity, edema, etc.): unable to get patient to EOB so unable to assess balance     Exercises     Assessment/Plan    PT Assessment Patient needs continued PT services  PT Problem List Decreased strength;Decreased mobility;Decreased safety awareness;Decreased coordination;Decreased activity tolerance;Decreased balance       PT Treatment Interventions DME instruction;Therapeutic activities;Gait training;Therapeutic exercise;Patient/family  education;Stair training;Balance training;Neuromuscular re-education;Wheelchair mobility training;Functional mobility training    PT Goals (Current goals can be found in the Care Plan section)  Acute Rehab PT Goals PT Goal Formulation: Patient unable to participate in goal setting    Frequency Min 2X/week   Barriers to discharge        Co-evaluation               AM-PAC PT "6 Clicks" Daily Activity  Outcome Measure Difficulty turning over in bed (including adjusting bedclothes, sheets and blankets)?: Unable Difficulty moving from lying on back to sitting on the side of the bed? : Unable Difficulty sitting down on and standing up from a chair with arms (e.g., wheelchair, bedside commode, etc,.)?: Unable Help needed moving to and from a bed to chair (including a wheelchair)?: Total Help needed walking in hospital room?: Total Help needed climbing 3-5 steps with a railing? : Total 6 Click Score: 6    End of Session   Activity Tolerance: Patient tolerated treatment well Patient left: in bed;with bed alarm set;with call bell/phone within reach   PT Visit Diagnosis: Muscle weakness (generalized) (M62.81)    Time: 1510-1520 PT Time Calculation (min) (ACUTE ONLY): 10 min   Charges:   PT Evaluation $PT Eval Moderate Complexity: 1 Mod          Deniece Ree PT, DPT, CBIS  Supplemental Physical Therapist [place]    Pager [phone] Acute Rehab Office [phone]

## 2017-10-28 NOTE — Progress Notes (Signed)
CSW updated patient's daughter that [facility] is unable to accept patient. Her second choice is [facility]. They will begin insurance authorization, though it may take several days.   Osborne Casco Dajanae Brophy LCSW [phone]

## 2017-10-29 LAB — URINE CULTURE: Culture: >=100000 — AB

## 2017-10-29 LAB — BASIC METABOLIC PANEL
Anion gap: 4 — ABNORMAL LOW (ref 5–15)
BUN: 10 mg/dL (ref 8–23)
CALCIUM: 8 mg/dL — AB (ref 8.9–10.3)
CHLORIDE: 108 mmol/L (ref 98–111)
CO2: 26 mmol/L (ref 22–32)
CREATININE: 0.67 mg/dL (ref 0.44–1.00)
GFR calc non Af Amer: >60 mL/min (ref >60)
GLUCOSE: 116 mg/dL — AB (ref 70–99)
Potassium: 3.6 mmol/L (ref 3.5–5.1)
Sodium: 138 mmol/L (ref 135–145)

## 2017-10-29 LAB — CBC WITH DIFFERENTIAL/PLATELET
Abs Immature Granulocytes: 0 10*3/uL (ref 0.0–0.1)
Basophils Absolute: 0.1 10*3/uL (ref 0.0–0.1)
Basophils Relative: 1 %
Eosinophils Absolute: 0.3 10*3/uL (ref 0.0–0.7)
Eosinophils Relative: 3 %
HEMATOCRIT: 38.7 % (ref 36.0–46.0)
HEMOGLOBIN: 12.5 g/dL (ref 12.0–15.0)
Immature Granulocytes: 0 %
LYMPHS ABS: 1.9 10*3/uL (ref 0.7–4.0)
LYMPHS PCT: 21 %
MCH: 29.6 pg (ref 26.0–34.0)
MCHC: 32.3 g/dL (ref 30.0–36.0)
MCV: 91.7 fL (ref 78.0–100.0)
MONO ABS: 0.8 10*3/uL (ref 0.1–1.0)
Monocytes Relative: 9 %
Neutro Abs: 6 10*3/uL (ref 1.7–7.7)
Neutrophils Relative %: 66 %
Platelets: 250 10*3/uL (ref 150–400)
RBC: 4.22 MIL/uL (ref 3.87–5.11)
RDW: 12.2 % (ref 11.5–15.5)
WBC: 9 10*3/uL (ref 4.0–10.5)

## 2017-10-29 MED ORDER — DORZOLAMIDE HCL-TIMOLOL MAL 2-0.5 % OP SOLN
1.0000 [drp] | Freq: Two times a day (BID) | OPHTHALMIC | Status: DC
  Administered 2017-10-29 – 2017-10-30 (×3): 1 [drp] via OPHTHALMIC
  Filled 2017-10-29: qty 10

## 2017-10-29 MED ORDER — CEFDINIR 300 MG PO CAPS
300.0000 mg | ORAL_CAPSULE | Freq: Two times a day (BID) | ORAL | Status: DC
  Administered 2017-10-29 – 2017-10-30 (×4): 300 mg via ORAL
  Filled 2017-10-29 (×4): qty 1

## 2017-10-29 MED ORDER — SODIUM CHLORIDE 0.9 % IV SOLN: 1.0000 g | INTRAVENOUS | Status: DC

## 2017-10-29 MED ORDER — TOBRAMYCIN-DEXAMETHASONE 0.3-0.1 % OP SUSP
1.0000 [drp] | OPHTHALMIC | Status: DC
  Administered 2017-10-29 – 2017-10-30 (×7): 1 [drp] via OPHTHALMIC
  Filled 2017-10-29: qty 2.5

## 2017-10-29 NOTE — Care Management Important Message (Signed)
Important Message  Patient Details  Name: Brittany Compton MRN: [id number] Date of Birth: [birth year]   Medicare Important Message Given:  Yes    Hodaya Curto 10/29/2017, 1:00 PM

## 2017-10-29 NOTE — Progress Notes (Signed)
  Speech Language Pathology Treatment: Dysphagia  Patient Details Name: Mayrani Khamis MRN: [id number] DOB: 28-Jan-1946 Today's Date: 10/29/2017 Time: 7829-5621 SLP Time Calculation (min) (ACUTE ONLY): 11 min  Assessment / Plan / Recommendation Clinical Impression  Pt consumed soft solids and thin liquids without overt signs of aspiration, but needing Min-Mod cues for sustained attention and bolus awareness. Pt was laughing throughout session today, often with a bolus in her oral cavity. Given her mentation and dependence for feeding, recommend continuing current chopped diet and thin liquids. SLP to f/u for potential to advance.   HPI HPI: Pt is a72 y.o.femalewho [facility] level and lethargy. Work-up significant for sepsis due to UTI. CT Head and CXR both negative for acute findings. PMH: dementia      SLP Plan  Continue with current plan of care       Recommendations  Diet recommendations: Dysphagia 2 (fine chop);Thin liquid Liquids provided via: Cup;Straw Medication Administration: Crushed with puree Supervision: Staff to assist with self feeding;Full supervision/cueing for compensatory strategies Compensations: Slow rate;Small sips/bites;Minimize environmental distractions Postural Changes and/or Swallow Maneuvers: Seated upright 90 degrees                Oral Care Recommendations: Oral care BID Follow up Recommendations: Skilled Nursing facility SLP Visit Diagnosis: Dysphagia, oral phase (R13.11) Plan: Continue with current plan of care       GO                Maxcine Ham 10/29/2017, 10:34 AM  Maxcine Ham, M.A. CCC-SLP Acute Herbalist [phone] Office [phone]

## 2017-10-29 NOTE — Progress Notes (Addendum)
Pharmacy Antibiotic Note  Aissatou Fronczak is a 72 y.o. female admitted on 10/26/2017 originally with concern for meningitis however now with concern for UTI. Pharmacy has been consulted to transition antibiotics to Cefepime monotherapy.   The patient does have an unknown allergy listed to penicillins. Will monitor closely with initial Cefepime dose as discussed with MD.   [facility] are back today and it's sens to ceftriaxone/cefepime. Maybe at dc, we can use omnicef or cefopodoxime. We will clarify the allergy today.  Plan: - Change cefepime to cefdinir 300mg  BID to complete 7d    Temp (24hrs), Avg:98.7 F (37.1 C), Min:98.2 F (36.8 C), Max:99 F (37.2 C)  Recent Labs  Lab 10/26/17 1559 10/26/17 1601 10/27/17 0615 10/28/17 0527 10/29/17 0442  WBC  --  12.2* 10.8* 10.6* 9.0  CREATININE  --  0.75 0.67 0.66 0.67  LATICACIDVEN 1.31  --   --   --   --     CrCl cannot be calculated (Unknown ideal weight.).    Allergies  Allergen Reactions  . Penicillins Other (See Comments)    Unknown reaction Has patient had a PCN reaction causing immediate rash, facial/tongue/throat swelling, SOB or lightheadedness with hypotension: Unknown Has patient had a PCN reaction causing severe rash involving mucus membranes or skin necrosis: Unknown Has patient had a PCN reaction that required hospitalization: Unknown Has patient had a PCN reaction occurring within the last 10 years: Unknown If all of the above answers are "NO", then may proceed with Cephalosporin use.     Antimicrobials this admission: Meropenem 9/28 x 1 Vancomycin 9/28 x 1 Cefepime 9/29 >>10/1 Cefdinir 10/1>>10/6  Dose adjustments this admission: None   Microbiology results: 9/28 UCx >>providencia sens - ceftriaxone,cefepime, imipenem 9/28 BCx >>  Ulyses Southward, PharmD, [place], [place], CPP Infectious Disease Pharmacist Pager: [phone] 10/29/2017 9:32 AM

## 2017-10-29 NOTE — Progress Notes (Addendum)
4pm-[employer] is requesting a peer to peer. Contact info given to MD.   3pm-[facility] has reported that patient is under medical review with the insurance company. CSW will continue to follow to see if a peer to peer is needed.   Osborne Casco Areana Kosanke LCSW [phone]

## 2017-10-29 NOTE — Consult Note (Signed)
CC:  Chief Complaint  Patient presents with  . Altered Mental Status    HPI: Brittany Compton is a 72 y.o. female w/o known POH (history of poor vision OD per report) and PMH below who presents for evaluation of conjunctival injection and abnormal pupil OD. Symptoms started close to the time of admission.  Patient was admitted for AMS with sepsis related to UTI. Patient has dementia and has very limited cognitive faculty at the time of examination.    ROS: Denies fever/chills, unintentional weight loss, chest pain, irregular heart rhythm, SOB, cough, wheezing, abdominal pain, melena, hematochezia, weakness, numbness, slurring of speech, facial droop, muscle weakness, joint pain, skin rash, tattoos, depressed mood  PMH: Past Medical History:  Diagnosis Date  . Dementia     PSH: Past Surgical History:  Procedure Laterality Date  . GALLBLADDER SURGERY      Meds: No current facility-administered medications on file prior to encounter.    Current Outpatient Medications on File Prior to Encounter  Medication Sig Dispense Refill  . acetaminophen (TYLENOL) 500 MG tablet Take 500 mg by mouth every 6 (six) hours as needed (pain).     Marland Kitchen dextromethorphan-guaiFENesin (TUSSIN DM) 10-100 MG/5ML liquid Take 10 mLs by mouth every 6 (six) hours as needed for cough.    . Melatonin 3 MG TABS Take 3 mg by mouth at bedtime.    . Skin Protectants, Misc. (BAZA PROTECT EX) Apply 1 application topically See admin instructions. Apply topically to sacral area every shift       SH: Social History   Socioeconomic History  . Marital status: Widowed    Spouse name: Not on file  . Number of children: Not on file  . Years of education: Not on file  . Highest education level: Not on file  Occupational History  . Not on file  Social Needs  . Financial resource strain: Not on file  . Food insecurity:    Worry: Not on file    Inability: Not on file  . Transportation needs:    Medical: Not on file   Non-medical: Not on file  Tobacco Use  . Smoking status: Never Smoker  . Smokeless tobacco: Never Used  Substance and Sexual Activity  . Alcohol use: No  . Drug use: Not on file  . Sexual activity: Not on file  Lifestyle  . Physical activity:    Days per week: Not on file    Minutes per session: Not on file  . Stress: Not on file  Relationships  . Social connections:    Talks on phone: Not on file    Gets together: Not on file    Attends religious service: Not on file    Active member of club or organization: Not on file    Attends meetings of clubs or organizations: Not on file    Relationship status: Not on file  Other Topics Concern  . Not on file  Social History Narrative  . Not on file    FH: Family History  Problem Relation Age of Onset  . Stroke Mother     Exam:  Zenaida Niece: OD: Aversive to light (unable to count fingers or respond to near card testing) OS: Aversive to light (unable to count fingers or respond to near card testing)  CVF: OD: unable OS: unable  EOM: OD: full d/v OS: full d/v  Pupils: OD: 5.57mm, poorly reactive, +APD by reverse OS: 3->2 mm, no APD  IOP: by [place] OD: 26 x 2;  dilated IOP was 25 OD OS: 20  External: OD: Mild periorbital edema, No proptosis, unable to assess V1-V3, good orbicularis OS: no periorbital edema, no proptosis, unable to assess V1-V3 intact, good orbicularis strength  PF: 5/7  Pen Light Exam: L/L: OD: 1+ upper and lower lid edema OS: WNL  C/S: OD: 2+ injection, greatest temporally, 1+ chemosis OS: white and quiet  K: OD: 3+D-folds, hazy OS: clear, no abnormal staining  A/C: OD: Unable to assess AC cell due corneal edema OS: grossly deep and quiet appearing by pen light  I: OD: round and irregular, dilated OS: round and regular  L: OD: 3+ NSC OS: 3+ NSC  DFE: dilated @ 5:15 w/ Tropic and Phenyl OU  V: OD: hazy view due to cornea, no obvious vitritis OS: clear  N: OD: C/D 0.8, pallor, no  disc edema OS: C/D 0.55, no disc edema  M: OD: flat, no obvious macular pathology OS: flat, no obvious macular pathology  V: OD: normal appearing vessels OS: normal appearing vessels  P: OD: retina flat 360, no obvious mass/RT/RD; no snowballs or lesions OS: retina flat 360, no obvious mass/RT/RD  CT Orbit (reviewed) - Air anteriorly OD, large phakic lens OD>OS. - No obvious vitritis  A/P:  1. 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  - Would recommend Cosopt BID OD. Stop Ofloxacin OD. Switch to Tobradex gtts QID OD to utilize steroid to hopefully decrease corneal edema.  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   - Patient to be discharged soon. Can follow-up outpatient with me as needed.   Thank you for this consultation. Please call with any concerns.  Wynell Balloon, MD,[facility] Ophthalmology [phone]

## 2017-10-29 NOTE — Progress Notes (Addendum)
PROGRESS NOTE                                                                                                                                                                                                             Patient Demographics:    Brittany Compton, is a 72 y.o. female, DOB - [birth year], WFU:[id number]  Admit date - 10/26/2017   Admitting Physician Reubin Milan, MD  Outpatient Primary MD for the patient is [facility]  LOS - 3   Chief Complaint  Patient presents with  . Altered Mental Status       Brief Narrative     72 y.o. female with medical history significant of dementia who [facility] who is being sent to the ER due to decreased activity level and lethargy, her work-up significant for sepsis due to UTI.   Subjective:    Brittany Compton  is nonverbal, no significant events overnight.   Assessment  & Plan :    Principal Problem:   Sepsis secondary to UTI [facility]) Active Problems:   Dementia ([place])   Hyperglycemia   Infection of right eye   Sepsis secondary to UTI  -Sepsis criteria met on admission, febrile, with leukocytosis and worsening mental status -Work-up significant for UTI, urine culture growing procidentia, sensitive to Rocephin, initially on cefepime, now she has been narrowed to Rocephin .   Dementia ([place]) -No supportive care, -I have discussed with daughter at bedside, she was slightly altered when she came, more lethargic than baseline, but she is total assist at baseline, nonverbal, apparently she is now back to baseline . -Seen by SLP, started on a diet .  Hypokalemia -Repleted  Right eye swelling -No significant findings on CT orbits, has some swelling between the globes and eyelids, I see no evidence of discharge or infection, but I will keep empirically on antibiotics eyedrops for now, dependent edema as she is having chronic right facial droop, edema almost resolved  with keeping here turned on the left side most of the day, but she still having some subconjunctival edema, and actually noted some erythema in  sclera, so I have consulted ophthalmology Dr Kathlen Mody, who will await patient this evening, ophthalmology assistance is greatly appreciated .   Code Status : Full  Family Communication  : Cussed with daughter at bedside  10/28/2017  Disposition Plan  :  SNF insurance approval obtained  Consults  : Pulmonology  Procedures  : None  DVT Prophylaxis  : Heparin  Lab Results  Component Value Date   PLT 250 10/29/2017    Antibiotics  :    Anti-infectives (From admission, onward)   Start     Dose/Rate Route Frequency Ordered Stop   10/29/17 1200  cefTRIAXone (ROCEPHIN) 1 g in sodium chloride 0.9 % 100 mL IVPB  Status:  Discontinued     1 g 200 mL/hr over 30 Minutes Intravenous Every 24 hours 10/29/17 0926 10/29/17 0940   10/29/17 1000  cefdinir (OMNICEF) capsule 300 mg     300 mg Oral Every 12 hours 10/29/17 0940 11/04/17 0959   10/27/17 0500  vancomycin (VANCOCIN) IVPB 750 mg/150 ml premix  Status:  Discontinued     750 mg 150 mL/hr over 60 Minutes Intravenous Every 12 hours 10/26/17 1728 10/26/17 2143   10/27/17 0200  ceFEPIme (MAXIPIME) 2 g in sodium chloride 0.9 % 100 mL IVPB  Status:  Discontinued     2 g 200 mL/hr over 30 Minutes Intravenous Every 12 hours 10/26/17 2146 10/29/17 0926   10/27/17 0100  meropenem (MERREM) 1 g in sodium chloride 0.9 % 100 mL IVPB  Status:  Discontinued     1 g 200 mL/hr over 30 Minutes Intravenous Every 8 hours 10/26/17 1728 10/26/17 1730   10/27/17 0100  meropenem (MERREM) 2 g in sodium chloride 0.9 % 100 mL IVPB  Status:  Discontinued     2 g 200 mL/hr over 30 Minutes Intravenous Every 8 hours 10/26/17 1730 10/26/17 2143   10/26/17 1615  vancomycin (VANCOCIN) 1,500 mg in sodium chloride 0.9 % 500 mL IVPB     1,500 mg 250 mL/hr over 120 Minutes Intravenous  Once 10/26/17 1523 10/26/17 1918   10/26/17 1600   ceFEPIme (MAXIPIME) 2 g in sodium chloride 0.9 % 100 mL IVPB  Status:  Discontinued     2 g 200 mL/hr over 30 Minutes Intravenous  Once 10/26/17 1523 10/26/17 1538   10/26/17 1600  meropenem (MERREM) 2 g in sodium chloride 0.9 % 100 mL IVPB     2 g 200 mL/hr over 30 Minutes Intravenous  Once 10/26/17 1548 10/26/17 1713   10/26/17 1545  ampicillin (OMNIPEN) 2 g in sodium chloride 0.9 % 100 mL IVPB  Status:  Discontinued     2 g 300 mL/hr over 20 Minutes Intravenous  Once 10/26/17 1539 10/26/17 1541   10/26/17 1545  cefTRIAXone (ROCEPHIN) 2 g in sodium chloride 0.9 % 100 mL IVPB  Status:  Discontinued     2 g 200 mL/hr over 30 Minutes Intravenous  Once 10/26/17 1539 10/26/17 1544        Objective:   Vitals:   10/29/17 0141 10/29/17 0546 10/29/17 0846 10/29/17 1234  BP: 118/68 (!) 124/95 126/65 (!) 128/111  Pulse: 93 88 86 89  Resp: 14 19    Temp: 99 F (37.2 C)  98.2 F (36.8 C) 98.6 F (37 C)  TempSrc: Oral  Oral Oral  SpO2: 100% 99% 100%     Wt Readings from Last 3 Encounters:  05/15/16 68.5 kg  03/16/16 68.7 kg  10/25/15 72.6 kg     Intake/Output Summary (Last 24 hours) at 10/29/2017 1422 Last data filed at 10/29/2017 0509 Gross per 24 hour  Intake 230 ml  Output 600 ml  Net -370 ml     Physical Exam  Awake, nonverbal, noncommunicative, chronic mild right facial droop subconjunctival edema , right pupil is nonreactive to light, mildly opaque, I have discussed with daughter at bedside, this is her baseline  symmetrical Chest wall movement, Good air movement bilaterally, CTAB RRR,No Gallops,Rubs or new Murmurs, No Parasternal Heave +ve B.Sounds, Abd Soft, No tenderness, No rebound - guarding or rigidity. No Cyanosis, Clubbing or edema, No new Rash or bruise        Data Review:    CBC Recent Labs  Lab 10/26/17 1601 10/27/17 0615 10/28/17 0527 10/29/17 0442  WBC 12.2* 10.8* 10.6* 9.0  HGB 14.4 13.1 13.3 12.5  HCT 45.5 40.4 41.7 38.7  PLT 303 225 263  250  MCV 93.4 92.4 92.1 91.7  MCH 29.6 30.0 29.4 29.6  MCHC 31.6 32.4 31.9 32.3  RDW 12.3 12.2 12.3 12.2  LYMPHSABS 1.2 1.6 1.7 1.9  MONOABS 0.5 1.0 1.0 0.8  EOSABS 0.0 0.1 0.1 0.3  BASOSABS 0.0 0.1 0.1 0.1    Chemistries  Recent Labs  Lab 10/26/17 1601 10/26/17 2230 10/27/17 0615 10/28/17 0527 10/29/17 0442  NA 138  --  140 139 138  K 3.6  --  3.1* 3.1* 3.6  CL 105  --  107 105 108  CO2 25  --  26 27 26   GLUCOSE 140*  --  114* 119* 116*  BUN 12  --  7* 9 10  CREATININE 0.75  --  0.67 0.66 0.67  CALCIUM 8.8*  --  8.1* 8.5* 8.0*  MG  --  1.9  --   --   --   AST 25  --  20  --   --   ALT 17  --  15  --   --   ALKPHOS 89  --  72  --   --   BILITOT 0.7  --  1.0  --   --    ------------------------------------------------------------------------------------------------------------------ No results for input(s): CHOL, HDL, LDLCALC, TRIG, CHOLHDL, LDLDIRECT in the last 72 hours.  No results found for: HGBA1C ------------------------------------------------------------------------------------------------------------------ No results for input(s): TSH, T4TOTAL, T3FREE, THYROIDAB in the last 72 hours.  Invalid input(s): FREET3 ------------------------------------------------------------------------------------------------------------------ No results for input(s): VITAMINB12, FOLATE, FERRITIN, TIBC, IRON, RETICCTPCT in the last 72 hours.  Coagulation profile No results for input(s): INR, PROTIME in the last 168 hours.  No results for input(s): DDIMER in the last 72 hours.  Cardiac Enzymes No results for input(s): CKMB, TROPONINI, MYOGLOBIN in the last 168 hours.  Invalid input(s): CK ------------------------------------------------------------------------------------------------------------------ No results found for: BNP  Inpatient Medications  Scheduled Meds: . cefdinir  300 mg Oral Q12H  . feeding supplement (ENSURE ENLIVE)  237 mL Oral BID BM  . Gerhardt's butt  cream   Topical TID  . heparin  5,000 Units Subcutaneous Q8H  . ofloxacin  1 drop Right Eye QID   Continuous Infusions: . sodium chloride 75 mL/hr at 10/29/17 0508   PRN Meds:.acetaminophen **OR** acetaminophen, hydrALAZINE, ondansetron **OR** ondansetron (ZOFRAN) IV  Micro Results Recent Results (from the past 240 hour(s))  Blood Culture (routine x 2)     Status: None (Preliminary result)   Collection Time: 10/26/17  4:01 PM  Result Value Ref Range Status   Specimen Description BLOOD RIGHT HAND  Final   Special Requests   Final    BOTTLES DRAWN AEROBIC AND ANAEROBIC Blood Culture results may not be optimal due to an inadequate volume of blood received in culture bottles   Culture   Final    NO  GROWTH 3 DAYS Performed at [facility], [place] [address] [place], [place] [address]    Report Status PENDING  Incomplete  Blood Culture (routine x 2)     Status: None (Preliminary result)   Collection Time: 10/26/17  4:02 PM  Result Value Ref Range Status   Specimen Description BLOOD LEFT HAND  Final   Special Requests   Final    BOTTLES DRAWN AEROBIC AND ANAEROBIC Blood Culture adequate volume   Culture   Final    NO GROWTH 3 DAYS Performed at [facility], [place] [address], [place], [place] [address]    Report Status PENDING  Incomplete  Urine culture     Status: Abnormal   Collection Time: 10/26/17  4:28 PM  Result Value Ref Range Status   Specimen Description URINE, CATHETERIZED  Final   Special Requests   Final    NONE Performed at [facility], [place] [address], [place], [place] [address]    Culture >=100,000 COLONIES/mL PROVIDENCIA STUARTII (A)  Final   Report Status 10/29/2017 FINAL  Final   Organism ID, Bacteria PROVIDENCIA STUARTII (A)  Final      Susceptibility   Providencia stuartii - MIC*    AMPICILLIN RESISTANT Resistant     CEFAZOLIN RESISTANT Resistant     CEFTRIAXONE <=1 SENSITIVE Sensitive     CIPROFLOXACIN >=4 RESISTANT Resistant     GENTAMICIN  RESISTANT Resistant     IMIPENEM 0.5 SENSITIVE Sensitive     NITROFURANTOIN 128 RESISTANT Resistant     TRIMETH/SULFA >=320 RESISTANT Resistant     AMPICILLIN/SULBACTAM <=2 SENSITIVE Sensitive     PIP/TAZO <=4 SENSITIVE Sensitive     * >=100,000 COLONIES/mL PROVIDENCIA STUARTII  MRSA PCR Screening     Status: None   Collection Time: 10/26/17 10:37 PM  Result Value Ref Range Status   MRSA by PCR NEGATIVE NEGATIVE Final    Comment:        The GeneXpert MRSA Assay (FDA approved for NASAL specimens only), is one component of a comprehensive MRSA colonization surveillance program. It is not intended to diagnose MRSA infection nor to guide or monitor treatment for MRSA infections. Performed at [facility], [place] [address] [place], [place] [address]     Radiology Reports Dg Chest 2 View  Result Date: 10/26/2017 CLINICAL DATA:  Pt presents for evaluation of decreased activity and lethargy. Lives at [facility], is nonverbal and wheelchair bound at baseline. Facility states LKW last night. Reports drooling to L mouth, leaning more towards L side. Facilit.*comment was truncated* EXAM: CHEST - 2 VIEW COMPARISON:  Radiograph 11/06/2016 FINDINGS: Normal cardiac silhouette. Low lung volumes. Mild bronchitic change. No effusion, infiltrate pneumothorax. Degenerate spurring of spine. IMPRESSION: Mild bronchitic change.  No acute findings.  Low lung volumes. Electronically Signed   By: Suzy Bouchard M.D.   On: 10/26/2017 16:39   Ct Head Wo Contrast  Result Date: 10/26/2017 CLINICAL DATA:  Altered mental status. EXAM: CT HEAD WITHOUT CONTRAST CT ORBITS WITH CONTRAST TECHNIQUE: Contiguous axial images were obtained from the base of the skull through the vertex without contrast. Multidetector CT imaging of the orbits was performed using the standard protocol with intravenous contrast. CONTRAST:  85m OMNIPAQUE IOHEXOL 300 MG/ML  SOLN COMPARISON:  Head CT 07/02/2017. FINDINGS: CT HEAD FINDINGS  Brain: There is no mass, hemorrhage or extra-axial collection. There is generalized brain atrophy greater than expected at this age. There is no acute or chronic infarction. There is  hypoattenuation of the periventricular white matter, most commonly indicating chronic ischemic microangiopathy. Vascular: No abnormal hyperdensity of the major intracranial arteries or dural venous sinuses. No intracranial atherosclerosis. Skull: The visualized skull base, calvarium and extracranial soft tissues are normal. CT ORBITS FINDINGS The orbits examination is severely motion degraded. Orbits: --Globes: Normal. --Bony orbit: Normal. --Preseptal soft tissues: There is a small fluid collection just anterior to the right globe. This may be fluid trapped beneath the eyelid. There is mild right eyelid edema. --Intra- and extraconal orbital fat: Normal. No inflammatory stranding. --Optic nerves: Normal. --Lacrimal glands and fossae: Normal. --Extraocular muscles: Normal. Visualized sinuses:  No fluid levels or advanced mucosal thickening. Soft tissues: Normal. IMPRESSION: 1. No acute intracranial abnormality. 2. 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 3. Severely motion degraded orbital examination. 4. Mild edema of the right eyelid with small underlying fluid collection between the eyelid and the globe. This may be simple fluid trapped beneath the eyelid. Superficial inflammatory processes such as chalazion are also possibilities. An abscess within the eyelid is less likely. Direct visualization is recommended. 5. No evidence for orbital (postseptal) cellulitis. Electronically Signed   By: Ulyses Jarred M.D.   On: 10/26/2017 18:36   Ct Orbits W Contrast  Result Date: 10/26/2017 CLINICAL DATA:  Altered mental status. EXAM: CT HEAD WITHOUT CONTRAST CT ORBITS WITH CONTRAST TECHNIQUE: Contiguous axial images were obtained from the base of the skull through the vertex without contrast. Multidetector CT  imaging of the orbits was performed using the standard protocol with intravenous contrast. CONTRAST:  73m OMNIPAQUE IOHEXOL 300 MG/ML  SOLN COMPARISON:  Head CT 07/02/2017. FINDINGS: CT HEAD FINDINGS Brain: There is no mass, hemorrhage or extra-axial collection. There is generalized brain atrophy greater than expected at this age. There is no acute or chronic infarction. There is hypoattenuation of the periventricular white matter, most commonly indicating chronic ischemic microangiopathy. Vascular: No abnormal hyperdensity of the major intracranial arteries or dural venous sinuses. No intracranial atherosclerosis. Skull: The visualized skull base, calvarium and extracranial soft tissues are normal. CT ORBITS FINDINGS The orbits examination is severely motion degraded. Orbits: --Globes: Normal. --Bony orbit: Normal. --Preseptal soft tissues: There is a small fluid collection just anterior to the right globe. This may be fluid trapped beneath the eyelid. There is mild right eyelid edema. --Intra- and extraconal orbital fat: Normal. No inflammatory stranding. --Optic nerves: Normal. --Lacrimal glands and fossae: Normal. --Extraocular muscles: Normal. Visualized sinuses:  No fluid levels or advanced mucosal thickening. Soft tissues: Normal. IMPRESSION: 1. No acute intracranial abnormality. 2. 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 3. Severely motion degraded orbital examination. 4. Mild edema of the right eyelid with small underlying fluid collection between the eyelid and the globe. This may be simple fluid trapped beneath the eyelid. Superficial inflammatory processes such as chalazion are also possibilities. An abscess within the eyelid is less likely. Direct visualization is recommended. 5. No evidence for orbital (postseptal) cellulitis. Electronically Signed   By: KUlyses JarredM.D.   On: 10/26/2017 18:36      DPhillips ClimesM.D on 10/29/2017 at 2:22 PM  Between 7am to 7pm -  Pager - [phone] After 7pm go to [email or web address] - password [email or web address] -  Office  [phone]

## 2017-10-30 DIAGNOSIS — H44001 Unspecified purulent endophthalmitis, right eye: Secondary | ICD-10-CM

## 2017-10-30 DIAGNOSIS — A419 Sepsis, unspecified organism: Principal | ICD-10-CM

## 2017-10-30 DIAGNOSIS — N390 Urinary tract infection, site not specified: Secondary | ICD-10-CM

## 2017-10-30 MED ORDER — DORZOLAMIDE HCL-TIMOLOL MAL 2-0.5 % OP SOLN: 1.0000 [drp] | Freq: Two times a day (BID) | OPHTHALMIC | 12 refills | Status: AC

## 2017-10-30 MED ORDER — GERHARDT'S BUTT CREAM: 1.0000 "application " | TOPICAL_CREAM | Freq: Three times a day (TID) | CUTANEOUS | 0 refills | Status: AC

## 2017-10-30 MED ORDER — CEFDINIR 300 MG PO CAPS: 300.0000 mg | ORAL_CAPSULE | Freq: Two times a day (BID) | ORAL | 0 refills | Status: DC

## 2017-10-30 MED ORDER — ENSURE ENLIVE PO LIQD: 237.0000 mL | Freq: Two times a day (BID) | ORAL | 0 refills | Status: AC

## 2017-10-30 MED ORDER — TOBRAMYCIN-DEXAMETHASONE 0.3-0.1 % OP SUSP: 1.0000 [drp] | OPHTHALMIC | 0 refills | Status: AC

## 2017-10-30 NOTE — Progress Notes (Signed)
[facility] arrived  To transport patient to SNF at 2200. PIV discontinued at this time vital signs are stable. No acute distress noted. Patient had received her night medication at 2100. Patient left the unit at exactly 2213.

## 2017-10-30 NOTE — Progress Notes (Signed)
Pt prepared for d/c to SNF. Skin intact except as charted in most recent assessments. Vitals are stable. Report called to receiving facility. Pt to be transported by ambulance service; still waiting for [facility] arrival.

## 2017-10-30 NOTE — NC FL2 (Deleted)
[place] MEDICAID FL2 LEVEL OF CARE SCREENING TOOL     IDENTIFICATION  Patient Name: Brittany Compton Birthdate: 1945-02-28 Sex: female Admission Date (Current Location): 10/26/2017  [facility] and [place] Number:  Producer, television/film/video and Address:  [facility] [place], [address], [place], [place] [address]      Provider Number: [id number]  Attending Physician Name and Address:  Maretta Bees, MD  Relative Name and Phone Number:  Reymundo Poll, daughter, [phone]    Current Level of Care: Hospital Recommended Level of Care: Assisted Living Facility Prior Approval Number:    Date Approved/Denied:   PASRR Number: [id number]  Discharge Plan: Other (Comment)(ALF)    Current Diagnoses: Patient Active Problem List   Diagnosis Date Noted  . Pressure injury of skin 10/27/2017  . Sepsis secondary to UTI (HCC) 10/26/2017  . Dementia (HCC) 10/26/2017  . Hyperglycemia 10/26/2017  . Infection of right eye 10/26/2017    Orientation RESPIRATION BLADDER Height & Weight     (Disoriented x4)  O2(Nasal cannula 2L) Incontinent Weight:   Height:     BEHAVIORAL SYMPTOMS/MOOD NEUROLOGICAL BOWEL NUTRITION STATUS  (Dementia but no wandering behaviors)   Continent Diet(mechanical soft)  AMBULATORY STATUS COMMUNICATION OF NEEDS Skin   Extensive Assist Verbally PU Stage and Appropriate Care(Stage II on buttocks)                       Personal Care Assistance Level of Assistance  Bathing, Feeding, Dressing Bathing Assistance: Maximum assistance Feeding assistance: Maximum assistance Dressing Assistance: Maximum assistance     Functional Limitations Info  Sight, Hearing, Speech Sight Info: Adequate Hearing Info: Adequate Speech Info: Adequate    SPECIAL CARE FACTORS FREQUENCY  PT (By licensed PT), OT (By licensed OT)     PT Frequency: 5x/week home health  OT Frequency: 3x/week            Contractures Contractures Info: Not present    Additional  Factors Info  Code Status, Allergies Code Status Info: Full Allergies Info: Penicillins           Current Medications (10/30/2017):     Discharge Medications: TAKE these medications   acetaminophen 500 MG tablet Commonly known as:  TYLENOL Take 500 mg by mouth every 6 (six) hours as needed (pain).   BAZA PROTECT EX Apply 1 application topically See admin instructions. Apply topically to sacral area every shift   cefdinir 300 MG capsule Commonly known as:  OMNICEF Take 1 capsule (300 mg total) by mouth every 12 (twelve) hours.   dorzolamide-timolol 22.3-6.8 MG/ML ophthalmic solution Commonly known as:  COSOPT Place 1 drop into the right eye 2 (two) times daily. For 7 days from 10/29/2017   feeding supplement (ENSURE ENLIVE) Liqd Take 237 mLs by mouth 2 (two) times daily between meals.   Gerhardt's butt cream Crea Apply 1 application topically 3 (three) times daily. Apply in a thin layer to buttocks, perineal area and medial and posterior thighs following cleansing of same with house skin cleanser.   Melatonin 3 MG Tabs Take 3 mg by mouth at bedtime.   tobramycin-dexamethasone ophthalmic solution Commonly known as:  TOBRADEX Place 1 drop into the right eye every 4 (four) hours while awake.   TUSSIN DM 10-100 MG/5ML liquid Generic drug:  dextromethorphan-guaiFENesin Take 10 mLs by mouth every 6 (six) hours as needed for cough.        Relevant Imaging Results:  Relevant Lab Results:   Additional Information  SSN: [id number]  Add home heal PT, OT, Speech  Renne Crigler Lash [place], LCSW

## 2017-10-30 NOTE — NC FL2 (Signed)
[place] MEDICAID FL2 LEVEL OF CARE SCREENING TOOL     IDENTIFICATION  Patient Name: Brittany Compton Birthdate: 07-Oct-1945 Sex: female Admission Date (Current Location): 10/26/2017  [facility] and [place] Number:  Producer, television/film/video and Address:  [facility] [place], [address], [place], [place] [address]      Provider Number: [id number]  Attending Physician Name and Address:  Maretta Bees, MD  Relative Name and Phone Number:  Reymundo Poll, daughter, [phone]    Current Level of Care: Hospital Recommended Level of Care: Assisted Living Facility Prior Approval Number:    Date Approved/Denied:   PASRR Number: [id number]  Discharge Plan: Other (Comment)(ALF)    Current Diagnoses: Patient Active Problem List   Diagnosis Date Noted  . Pressure injury of skin 10/27/2017  . Sepsis secondary to UTI (HCC) 10/26/2017  . Dementia (HCC) 10/26/2017  . Hyperglycemia 10/26/2017  . Infection of right eye 10/26/2017    Orientation RESPIRATION BLADDER Height & Weight     (Disoriented x4)  Normal Incontinent Weight:   Height:     BEHAVIORAL SYMPTOMS/MOOD NEUROLOGICAL BOWEL NUTRITION STATUS  (Dementia but no wandering behaviors)   Continent Diet(mechanical soft)  AMBULATORY STATUS COMMUNICATION OF NEEDS Skin   Extensive Assist Verbally PU Stage and Appropriate Care(Stage II on buttocks)                       Personal Care Assistance Level of Assistance  Bathing, Feeding, Dressing Bathing Assistance: Maximum assistance Feeding assistance: Maximum assistance Dressing Assistance: Maximum assistance     Functional Limitations Info  Sight, Hearing, Speech Sight Info: Adequate Hearing Info: Adequate Speech Info: Adequate    SPECIAL CARE FACTORS FREQUENCY  PT (By licensed PT), OT (By licensed OT)     PT Frequency: 5x/week home health  OT Frequency: 3x/week            Contractures Contractures Info: Not present    Additional Factors Info   Code Status, Allergies Code Status Info: Full Allergies Info: Penicillins           Current Medications (10/30/2017):  Discharge Medications:  TAKE these medications   acetaminophen 500 MG tablet Commonly known as:  TYLENOL Take 500 mg by mouth every 6 (six) hours as needed (pain).   BAZA PROTECT EX Apply 1 application topically See admin instructions. Apply topically to sacral area every shift   cefdinir 300 MG capsule Commonly known as:  OMNICEF Take 1 capsule (300 mg total) by mouth every 12 (twelve) hours.   dorzolamide-timolol 22.3-6.8 MG/ML ophthalmic solution Commonly known as:  COSOPT Place 1 drop into the right eye 2 (two) times daily. For 7 days from 10/29/2017   feeding supplement (ENSURE ENLIVE) Liqd Take 237 mLs by mouth 2 (two) times daily between meals.   Gerhardt's butt cream Crea Apply 1 application topically 3 (three) times daily. Apply in a thin layer to buttocks, perineal area and medial and posterior thighs following cleansing of same with house skin cleanser.   Melatonin 3 MG Tabs Take 3 mg by mouth at bedtime.   tobramycin-dexamethasone ophthalmic solution Commonly known as:  TOBRADEX Place 1 drop into the right eye every 4 (four) hours while awake.   TUSSIN DM 10-100 MG/5ML liquid Generic drug:  dextromethorphan-guaiFENesin Take 10 mLs by mouth every 6 (six) hours as needed for cough.      Relevant Imaging Results:  Relevant Lab Results:   Additional Information SSN: [id number]  Add  home health PT, OT, Speech  Mearl Latin, LCSW

## 2017-10-30 NOTE — Progress Notes (Signed)
Physical Therapy Treatment Patient Details Name: Brittany Compton MRN: [id number] DOB: 01/19/1946 Today's Date: 10/30/2017    History of Present Illness 72yo female sent to the ED from [facility] facility due to reduced activity/increased lethargy. Her baseline is non-verbal and WC bound, but she is very active in her WC. Diagnosed with Sepsis secondary to UTI.  PMH dementia     PT Comments    Session focused on bed mobility and static sitting balance. Patient seemingly more alert this session with visual tracking of therapist noted but does not follow simple commands. Requiring two person total assistance for progressing to edge of bed with increased truncal tone. However, patient was able to perform static seated balance with supervision and no UE support. Continue to recommend SNF for ongoing Physical Therapy.       Follow Up Recommendations  SNF     Equipment Recommendations  Other (comment)(defer to next venue)    Recommendations for Other Services       Precautions / Restrictions Precautions Precautions: Fall Precaution Comments: non-verbal and non-ambulatory at baseline  Restrictions Weight Bearing Restrictions: No    Mobility  Bed Mobility Overal bed mobility: Needs Assistance Bed Mobility: Supine to Sit;Sit to Supine     Supine to sit: Total assist;+2 for physical assistance Sit to supine: Total assist;+2 for physical assistance   General bed mobility comments: Total assistance for all aspects of bed mobility; no initiation by patient. Noted increased truncal tone  Transfers                    Ambulation/Gait             General Gait Details: non-ambulatory at baseline    Stairs             Wheelchair Mobility    Modified Rankin (Stroke Patients Only)       Balance Overall balance assessment: Needs assistance Sitting-balance support: No upper extremity supported;Feet supported Sitting balance-Leahy Scale: Fair Sitting balance -  Comments: Patient able to progress to static sitting balance with no UE support with supervision. Manual placement of feet for support                                    Cognition Arousal/Alertness: Awake/alert Behavior During Therapy: Flat affect Overall Cognitive Status: History of cognitive impairments - at baseline                                 General Comments: history of dementia at baseline. Laughing throughout session today. Able to visually track therapist but unable to follow simple commands       Exercises      General Comments        Pertinent Vitals/Pain Pain Assessment: Faces Faces Pain Scale: No hurt    Home Living                      Prior Function            PT Goals (current goals can now be found in the care plan section) Acute Rehab PT Goals PT Goal Formulation: Patient unable to participate in goal setting    Frequency    Min 2X/week      PT Plan Current plan remains appropriate    Co-evaluation  AM-PAC PT "6 Clicks" Daily Activity  Outcome Measure  Difficulty turning over in bed (including adjusting bedclothes, sheets and blankets)?: Unable Difficulty moving from lying on back to sitting on the side of the bed? : Unable Difficulty sitting down on and standing up from a chair with arms (e.g., wheelchair, bedside commode, etc,.)?: Unable Help needed moving to and from a bed to chair (including a wheelchair)?: Total Help needed walking in hospital room?: Total Help needed climbing 3-5 steps with a railing? : Total 6 Click Score: 6    End of Session   Activity Tolerance: Patient tolerated treatment well Patient left: in bed;with bed alarm set;with call bell/phone within reach   PT Visit Diagnosis: Muscle weakness (generalized) (M62.81)     Time: 1610-9604 PT Time Calculation (min) (ACUTE ONLY): 10 min  Charges:  $Therapeutic Activity: 8-22 mins                      Laurina Bustle, PT, DPT Acute Rehabilitation Services Pager [phone] Office [phone]    Brittany Compton 10/30/2017, 10:14 AM

## 2017-10-30 NOTE — Progress Notes (Addendum)
4pm- Patient's daughter requested that patient be discharged today to [facility]. She does not feel that it is helpful to continue to wait on a possible denial from [facility] for SNF. CSW discussed differences in care levels for ALFs and long term care for future planning for patient. CSW will alert ALF to add home health services to see patient. MD aware.   3:17pm-[facility] still has not received a decision from [place]. CSW left patient's daughter a voicemail to discuss disposition.   Osborne Casco Kaiyden Simkin LCSW [phone]

## 2017-10-30 NOTE — Discharge Summary (Addendum)
PATIENT DETAILS Name: Brittany Compton Age: 72 y.o. Sex: female Date of Birth: [birth year] MRN: [id number]. Admitting Physician: Bobette Mo, MD WUJ:[id number], [facility]  Admit Date: 10/26/2017 Discharge date: 10/30/2017  Recommendations for Outpatient Follow-up:  1. Follow up with PCP in 1-2 weeks 2. Please obtain BMP/CBC in one week  Admitted From:  ALF   Disposition: SNF vs ALF depending on insurance approval  Addendum 3:54 PM: Per social work-they have talked with family-family okay to being discharged to ALF with home health services  Home Health: No  Equipment/Devices: None  Discharge Condition: Stable  CODE STATUS: FULL CODE  Diet recommendation:  Heart Healthy -Dys 2 diet  Brief Summary: See H&P, Labs, Consult and Test reports for all details in brief, patient is a 72 year old female with history of dementia-admitted for evaluation of sepsis secondary to UTI.  See below for further details.  Brief Hospital Course: Sepsis secondary to UTI: Sepsis pathophysiology has resolved-initially on cefepime-urine culture is positive for procidentia-has been narrowed to Rocephin and subsequently narrowed to [facility].  Blood cultures negative-stable for discharge:  Dementia with delirium: Pleasantly confused-but with some mild delirium this morning-not very far from the usual baseline.  Seen by speech therapy-started on a dysphagia 2 diet which is being continued.  Right eye swelling: Evaluated by ophthalmology-felt to have corneal edema w/ Amaurotic Pupil-recommendations are to continue Cosopt (7 days from 10/1), TobraDex on discharge (7 days from 10/1).  Please ensure outpatient follow-up with ophthalmology.  Procedures/Studies: None  Discharge Diagnoses:  Principal Problem:   Sepsis secondary to UTI [facility]) Active Problems:   Dementia (HCC)   Hyperglycemia   Infection of right eye   Discharge Instructions:  Activity:  As tolerated   Discharge  Instructions    Diet - low sodium heart healthy   Complete by:  As directed    Diet recommendations: Dysphagia 2 (fine chop);Thin liquid Liquids provided via: Cup;Straw Medication Administration: Crushed with puree Supervision: Staff to assist with self feeding;Full supervision/cueing for compensatory strategies Compensations: Slow rate;Small sips/bites;Minimize environmental distractions Postural Changes and/or Swallow Maneuvers: Seated upright 90 degrees   Increase activity slowly   Complete by:  As directed      Allergies as of 10/30/2017      Reactions   Penicillins Other (See Comments)   Unknown reaction Has patient had a PCN reaction causing immediate rash, facial/tongue/throat swelling, SOB or lightheadedness with hypotension: Unknown Has patient had a PCN reaction causing severe rash involving mucus membranes or skin necrosis: Unknown Has patient had a PCN reaction that required hospitalization: Unknown Has patient had a PCN reaction occurring within the last 10 years: Unknown If all of the above answers are "NO", then may proceed with Cephalosporin use.      Medication List    TAKE these medications   acetaminophen 500 MG tablet Commonly known as:  TYLENOL Take 500 mg by mouth every 6 (six) hours as needed (pain).   BAZA PROTECT EX Apply 1 application topically See admin instructions. Apply topically to sacral area every shift   cefdinir 300 MG capsule Commonly known as:  OMNICEF Take 1 capsule (300 mg total) by mouth every 12 (twelve) hours.   dorzolamide-timolol 22.3-6.8 MG/ML ophthalmic solution Commonly known as:  COSOPT Place 1 drop into the right eye 2 (two) times daily. For 7 days from 10/29/2017   feeding supplement (ENSURE ENLIVE) Liqd Take 237 mLs by mouth 2 (two) times daily between meals.   Gerhardt's butt cream Crea Apply 1 application  topically 3 (three) times daily. Apply in a thin layer to buttocks, perineal area and medial and posterior thighs  following cleansing of same with house skin cleanser.   Melatonin 3 MG Tabs Take 3 mg by mouth at bedtime.   tobramycin-dexamethasone ophthalmic solution Commonly known as:  TOBRADEX Place 1 drop into the right eye every 4 (four) hours while awake.   TUSSIN DM 10-100 MG/5ML liquid Generic drug:  dextromethorphan-guaiFENesin Take 10 mLs by mouth every 6 (six) hours as needed for cough.       Contact information for follow-up providers    [facility], [facility]. Schedule an appointment as soon as possible for a visit in 1 week(s).   Specialty:  Geriatric Medicine Contact information: [address] [place] [address] [phone]            Contact information for after-discharge care    Destination    [facility] Preferred SNF .   Service:  Skilled Nursing Contact information: [address] [place] [address] [phone]                 Allergies  Allergen Reactions  . Penicillins Other (See Comments)    Unknown reaction Has patient had a PCN reaction causing immediate rash, facial/tongue/throat swelling, SOB or lightheadedness with hypotension: Unknown Has patient had a PCN reaction causing severe rash involving mucus membranes or skin necrosis: Unknown Has patient had a PCN reaction that required hospitalization: Unknown Has patient had a PCN reaction occurring within the last 10 years: Unknown If all of the above answers are "NO", then may proceed with Cephalosporin use.     Consultations:   None  Other Procedures/Studies: Dg Chest 2 View  Result Date: 10/26/2017 CLINICAL DATA:  Pt presents for evaluation of decreased activity and lethargy. Lives at [facility], is nonverbal and wheelchair bound at baseline. Facility states LKW last night. Reports drooling to L mouth, leaning more towards L side. Facilit.*comment was truncated* EXAM: CHEST - 2 VIEW COMPARISON:  Radiograph 11/06/2016 FINDINGS: Normal  cardiac silhouette. Low lung volumes. Mild bronchitic change. No effusion, infiltrate pneumothorax. Degenerate spurring of spine. IMPRESSION: Mild bronchitic change.  No acute findings.  Low lung volumes. Electronically Signed   By: Genevive Bi M.D.   On: 10/26/2017 16:39   Ct Head Wo Contrast  Result Date: 10/26/2017 CLINICAL DATA:  Altered mental status. EXAM: CT HEAD WITHOUT CONTRAST CT ORBITS WITH CONTRAST TECHNIQUE: Contiguous axial images were obtained from the base of the skull through the vertex without contrast. Multidetector CT imaging of the orbits was performed using the standard protocol with intravenous contrast. CONTRAST:  75mL OMNIPAQUE IOHEXOL 300 MG/ML  SOLN COMPARISON:  Head CT 07/02/2017. FINDINGS: CT HEAD FINDINGS Brain: There is no mass, hemorrhage or extra-axial collection. There is generalized brain atrophy greater than expected at this age. There is no acute or chronic infarction. There is hypoattenuation of the periventricular white matter, most commonly indicating chronic ischemic microangiopathy. Vascular: No abnormal hyperdensity of the major intracranial arteries or dural venous sinuses. No intracranial atherosclerosis. Skull: The visualized skull base, calvarium and extracranial soft tissues are normal. CT ORBITS FINDINGS The orbits examination is severely motion degraded. Orbits: --Globes: Normal. --Bony orbit: Normal. --Preseptal soft tissues: There is a small fluid collection just anterior to the right globe. This may be fluid trapped beneath the eyelid. There is mild right eyelid edema. --Intra- and extraconal orbital fat: Normal. No inflammatory stranding. --Optic nerves: Normal. --Lacrimal glands and fossae:  Normal. --Extraocular muscles: Normal. Visualized sinuses:  No fluid levels or advanced mucosal thickening. Soft tissues: Normal. IMPRESSION: 1. No acute intracranial abnormality. 2. 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 3. Severely  motion degraded orbital examination. 4. Mild edema of the right eyelid with small underlying fluid collection between the eyelid and the globe. This may be simple fluid trapped beneath the eyelid. Superficial inflammatory processes such as chalazion are also possibilities. An abscess within the eyelid is less likely. Direct visualization is recommended. 5. No evidence for orbital (postseptal) cellulitis. Electronically Signed   By: Deatra Robinson M.D.   On: 10/26/2017 18:36   Ct Orbits W Contrast  Result Date: 10/26/2017 CLINICAL DATA:  Altered mental status. EXAM: CT HEAD WITHOUT CONTRAST CT ORBITS WITH CONTRAST TECHNIQUE: Contiguous axial images were obtained from the base of the skull through the vertex without contrast. Multidetector CT imaging of the orbits was performed using the standard protocol with intravenous contrast. CONTRAST:  75mL OMNIPAQUE IOHEXOL 300 MG/ML  SOLN COMPARISON:  Head CT 07/02/2017. FINDINGS: CT HEAD FINDINGS Brain: There is no mass, hemorrhage or extra-axial collection. There is generalized brain atrophy greater than expected at this age. There is no acute or chronic infarction. There is hypoattenuation of the periventricular white matter, most commonly indicating chronic ischemic microangiopathy. Vascular: No abnormal hyperdensity of the major intracranial arteries or dural venous sinuses. No intracranial atherosclerosis. Skull: The visualized skull base, calvarium and extracranial soft tissues are normal. CT ORBITS FINDINGS The orbits examination is severely motion degraded. Orbits: --Globes: Normal. --Bony orbit: Normal. --Preseptal soft tissues: There is a small fluid collection just anterior to the right globe. This may be fluid trapped beneath the eyelid. There is mild right eyelid edema. --Intra- and extraconal orbital fat: Normal. No inflammatory stranding. --Optic nerves: Normal. --Lacrimal glands and fossae: Normal. --Extraocular muscles: Normal. Visualized sinuses:  No  fluid levels or advanced mucosal thickening. Soft tissues: Normal. IMPRESSION: 1. No acute intracranial abnormality. 2. 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 3. Severely motion degraded orbital examination. 4. Mild edema of the right eyelid with small underlying fluid collection between the eyelid and the globe. This may be simple fluid trapped beneath the eyelid. Superficial inflammatory processes such as chalazion are also possibilities. An abscess within the eyelid is less likely. Direct visualization is recommended. 5. No evidence for orbital (postseptal) cellulitis. Electronically Signed   By: Deatra Robinson M.D.   On: 10/26/2017 18:36      TODAY-DAY OF DISCHARGE:  Subjective:   Brittany Compton today remains pleasantly confused  Objective:   Blood pressure (!) 116/92, pulse 79, temperature 98.4 F (36.9 C), temperature source Oral, resp. rate 17, SpO2 99 %.  Intake/Output Summary (Last 24 hours) at 10/30/2017 1051 Last data filed at 10/30/2017 0259 Gross per 24 hour  Intake 480 ml  Output 1150 ml  Net -670 ml   There were no vitals filed for this visit.  Exam: Awake pleasantly confused, No new F.N deficits, Normal affect .AT,PERRAL Supple Neck,No JVD, No cervical lymphadenopathy appriciated.  Symmetrical Chest wall movement, Good air movement bilaterally, CTAB RRR,No Gallops,Rubs or new Murmurs, No Parasternal Heave +ve B.Sounds, Abd Soft, Non tender, No organomegaly appriciated, No rebound -guarding or rigidity. No Cyanosis, Clubbing or edema, No new Rash or bruise   PERTINENT RADIOLOGIC STUDIES: Dg Chest 2 View  Result Date: 10/26/2017 CLINICAL DATA:  Pt presents for evaluation of decreased activity and lethargy. Lives at [facility], is nonverbal and wheelchair bound at baseline. Facility states  LKW last night. Reports drooling to L mouth, leaning more towards L side. Facilit.*comment was truncated* EXAM: CHEST - 2 VIEW COMPARISON:  Radiograph  11/06/2016 FINDINGS: Normal cardiac silhouette. Low lung volumes. Mild bronchitic change. No effusion, infiltrate pneumothorax. Degenerate spurring of spine. IMPRESSION: Mild bronchitic change.  No acute findings.  Low lung volumes. Electronically Signed   By: Genevive Bi M.D.   On: 10/26/2017 16:39   Ct Head Wo Contrast  Result Date: 10/26/2017 CLINICAL DATA:  Altered mental status. EXAM: CT HEAD WITHOUT CONTRAST CT ORBITS WITH CONTRAST TECHNIQUE: Contiguous axial images were obtained from the base of the skull through the vertex without contrast. Multidetector CT imaging of the orbits was performed using the standard protocol with intravenous contrast. CONTRAST:  75mL OMNIPAQUE IOHEXOL 300 MG/ML  SOLN COMPARISON:  Head CT 07/02/2017. FINDINGS: CT HEAD FINDINGS Brain: There is no mass, hemorrhage or extra-axial collection. There is generalized brain atrophy greater than expected at this age. There is no acute or chronic infarction. There is hypoattenuation of the periventricular white matter, most commonly indicating chronic ischemic microangiopathy. Vascular: No abnormal hyperdensity of the major intracranial arteries or dural venous sinuses. No intracranial atherosclerosis. Skull: The visualized skull base, calvarium and extracranial soft tissues are normal. CT ORBITS FINDINGS The orbits examination is severely motion degraded. Orbits: --Globes: Normal. --Bony orbit: Normal. --Preseptal soft tissues: There is a small fluid collection just anterior to the right globe. This may be fluid trapped beneath the eyelid. There is mild right eyelid edema. --Intra- and extraconal orbital fat: Normal. No inflammatory stranding. --Optic nerves: Normal. --Lacrimal glands and fossae: Normal. --Extraocular muscles: Normal. Visualized sinuses:  No fluid levels or advanced mucosal thickening. Soft tissues: Normal. IMPRESSION: 1. No acute intracranial abnormality. 2. Advanced chronic ischemic microangiopathy and  parenchymal atrophy. 3. Severely motion degraded orbital examination. 4. Mild edema of the right eyelid with small underlying fluid collection between the eyelid and the globe. This may be simple fluid trapped beneath the eyelid. Superficial inflammatory processes such as chalazion are also possibilities. An abscess within the eyelid is less likely. Direct visualization is recommended. 5. No evidence for orbital (postseptal) cellulitis. Electronically Signed   By: Deatra Robinson M.D.   On: 10/26/2017 18:36   Ct Orbits W Contrast  Result Date: 10/26/2017 CLINICAL DATA:  Altered mental status. EXAM: CT HEAD WITHOUT CONTRAST CT ORBITS WITH CONTRAST TECHNIQUE: Contiguous axial images were obtained from the base of the skull through the vertex without contrast. Multidetector CT imaging of the orbits was performed using the standard protocol with intravenous contrast. CONTRAST:  75mL OMNIPAQUE IOHEXOL 300 MG/ML  SOLN COMPARISON:  Head CT 07/02/2017. FINDINGS: CT HEAD FINDINGS Brain: There is no mass, hemorrhage or extra-axial collection. There is generalized brain atrophy greater than expected at this age. There is no acute or chronic infarction. There is hypoattenuation of the periventricular white matter, most commonly indicating chronic ischemic microangiopathy. Vascular: No abnormal hyperdensity of the major intracranial arteries or dural venous sinuses. No intracranial atherosclerosis. Skull: The visualized skull base, calvarium and extracranial soft tissues are normal. CT ORBITS FINDINGS The orbits examination is severely motion degraded. Orbits: --Globes: Normal. --Bony orbit: Normal. --Preseptal soft tissues: There is a small fluid collection just anterior to the right globe. This may be fluid trapped beneath the eyelid. There is mild right eyelid edema. --Intra- and extraconal orbital fat: Normal. No inflammatory stranding. --Optic nerves: Normal. --Lacrimal glands and fossae: Normal. --Extraocular muscles:  Normal. Visualized sinuses:  No fluid levels or advanced  mucosal thickening. Soft tissues: Normal. IMPRESSION: 1. No acute intracranial abnormality. 2. 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 3. Severely motion degraded orbital examination. 4. Mild edema of the right eyelid with small underlying fluid collection between the eyelid and the globe. This may be simple fluid trapped beneath the eyelid. Superficial inflammatory processes such as chalazion are also possibilities. An abscess within the eyelid is less likely. Direct visualization is recommended. 5. No evidence for orbital (postseptal) cellulitis. Electronically Signed   By: Deatra Robinson M.D.   On: 10/26/2017 18:36     PERTINENT LAB RESULTS: CBC: Recent Labs    10/28/17 0527 10/29/17 0442  WBC 10.6* 9.0  HGB 13.3 12.5  HCT 41.7 38.7  PLT 263 250   CMET CMP     Component Value Date/Time   NA 138 10/29/2017 0442   K 3.6 10/29/2017 0442   CL 108 10/29/2017 0442   CO2 26 10/29/2017 0442   GLUCOSE 116 (H) 10/29/2017 0442   BUN 10 10/29/2017 0442   CREATININE 0.67 10/29/2017 0442   CALCIUM 8.0 (L) 10/29/2017 0442   PROT 5.9 (L) 10/27/2017 0615   ALBUMIN 2.9 (L) 10/27/2017 0615   AST 20 10/27/2017 0615   ALT 15 10/27/2017 0615   ALKPHOS 72 10/27/2017 0615   BILITOT 1.0 10/27/2017 0615   GFRNONAA >60 10/29/2017 0442   GFRAA >60 10/29/2017 0442    GFR CrCl cannot be calculated (Unknown ideal weight.). No results for input(s): LIPASE, AMYLASE in the last 72 hours. No results for input(s): CKTOTAL, CKMB, CKMBINDEX, TROPONINI in the last 72 hours. Invalid input(s): POCBNP No results for input(s): DDIMER in the last 72 hours. No results for input(s): HGBA1C in the last 72 hours. No results for input(s): CHOL, HDL, LDLCALC, TRIG, CHOLHDL, LDLDIRECT in the last 72 hours. No results for input(s): TSH, T4TOTAL, T3FREE, THYROIDAB in the last 72 hours.  Invalid input(s): FREET3 No results for input(s):  VITAMINB12, FOLATE, FERRITIN, TIBC, IRON, RETICCTPCT in the last 72 hours. Coags: No results for input(s): INR in the last 72 hours.  Invalid input(s): PT Microbiology: Recent Results (from the past 240 hour(s))  Blood Culture (routine x 2)     Status: None (Preliminary result)   Collection Time: 10/26/17  4:01 PM  Result Value Ref Range Status   Specimen Description BLOOD RIGHT HAND  Final   Special Requests   Final    BOTTLES DRAWN AEROBIC AND ANAEROBIC Blood Culture results may not be optimal due to an inadequate volume of blood received in culture bottles   Culture   Final    NO GROWTH 4 DAYS Performed at [facility], [address] [place], [place] [address]    Report Status PENDING  Incomplete  Blood Culture (routine x 2)     Status: None (Preliminary result)   Collection Time: 10/26/17  4:02 PM  Result Value Ref Range Status   Specimen Description BLOOD LEFT HAND  Final   Special Requests   Final    BOTTLES DRAWN AEROBIC AND ANAEROBIC Blood Culture adequate volume   Culture   Final    NO GROWTH 4 DAYS Performed at [facility], [address] [place], [place] [address]    Report Status PENDING  Incomplete  Urine culture     Status: Abnormal   Collection Time: 10/26/17  4:28 PM  Result Value Ref Range Status   Specimen Description URINE, CATHETERIZED  Final   Special Requests   Final  NONE Performed at [facility], [address], [place], [place] [address]    Culture >=100,000 COLONIES/mL PROVIDENCIA STUARTII (A)  Final   Report Status 10/29/2017 FINAL  Final   Organism ID, Bacteria PROVIDENCIA STUARTII (A)  Final      Susceptibility   Providencia stuartii - MIC*    AMPICILLIN RESISTANT Resistant     CEFAZOLIN RESISTANT Resistant     CEFTRIAXONE <=1 SENSITIVE Sensitive     CIPROFLOXACIN >=4 RESISTANT Resistant     GENTAMICIN RESISTANT Resistant     IMIPENEM 0.5 SENSITIVE Sensitive     NITROFURANTOIN 128 RESISTANT Resistant      TRIMETH/SULFA >=320 RESISTANT Resistant     AMPICILLIN/SULBACTAM <=2 SENSITIVE Sensitive     PIP/TAZO <=4 SENSITIVE Sensitive     * >=100,000 COLONIES/mL PROVIDENCIA STUARTII  MRSA PCR Screening     Status: None   Collection Time: 10/26/17 10:37 PM  Result Value Ref Range Status   MRSA by PCR NEGATIVE NEGATIVE Final    Comment:        The GeneXpert MRSA Assay (FDA approved for NASAL specimens only), is one component of a comprehensive MRSA colonization surveillance program. It is not intended to diagnose MRSA infection nor to guide or monitor treatment for MRSA infections. Performed at [facility], [address], [place], [place] [address]     FURTHER DISCHARGE INSTRUCTIONS:  Get Medicines reviewed and adjusted: Please take all your medications with you for your next visit with your Primary MD  Laboratory/radiological data: Please request your Primary MD to go over all hospital tests and procedure/radiological results at the follow up, please ask your Primary MD to get all Hospital records sent to his/her office.  In some cases, they will be blood work, cultures and biopsy results pending at the time of your discharge. Please request that your primary care M.D. goes through all the records of your hospital data and follows up on these results.  Also Note the following: If you experience worsening of your admission symptoms, develop shortness of breath, life threatening emergency, suicidal or homicidal thoughts you must seek medical attention immediately by calling 911 or calling your MD immediately  if symptoms less severe.  You must read complete instructions/literature along with all the possible adverse reactions/side effects for all the Medicines you take and that have been prescribed to you. Take any new Medicines after you have completely understood and accpet all the possible adverse reactions/side effects.   Do not drive when taking Pain medications or sleeping  medications (Benzodaizepines)  Do not take more than prescribed Pain, Sleep and Anxiety Medications. It is not advisable to combine anxiety,sleep and pain medications without talking with your primary care practitioner  Special Instructions: If you have smoked or chewed Tobacco  in the last 2 yrs please stop smoking, stop any regular Alcohol  and or any Recreational drug use.  Wear Seat belts while driving.  Please note: You were cared for by a hospitalist during your hospital stay. Once you are discharged, your primary care physician will handle any further medical issues. Please note that NO REFILLS for any discharge medications will be authorized once you are discharged, as it is imperative that you return to your primary care physician (or establish a relationship with a primary care physician if you do not have one) for your post hospital discharge needs so that they can reassess your need for medications and monitor your lab values.  Total Time spent coordinating  discharge including counseling, education and face to face time equals 35 minutes.  SignedJeoffrey Massed 10/30/2017 10:51 AM

## 2017-10-30 NOTE — Progress Notes (Signed)
Patient will DC to: [facility] Anticipated DC date: 10/30/17 Family notified: Daughter, Geophysicist/field seismologist by: PTAR 6:30pm   Per MD patient ready for DC to . RN, patient, patient's family, and facility notified of DC. Discharge Summary and FL2 sent to facility. RN to call report prior to discharge [phone]). DC packet on chart. Ambulance transport requested for patient.   CSW will sign off for now as social work intervention is no longer needed. Please consult [place] again if new needs arise.  Cristobal Goldmann, LCSW Clinical Social Worker [phone]

## 2017-10-30 NOTE — Progress Notes (Signed)
Requested PT note sent to [facility]. Waiting for review.   Osborne Casco Beronica [place]dale LCSW [phone]

## 2017-10-31 LAB — CULTURE, BLOOD (ROUTINE X 2)
CULTURE: NO GROWTH
CULTURE: NO GROWTH
Special Requests: ADEQUATE

## 2017-11-01 ENCOUNTER — Encounter (HOSPITAL_COMMUNITY): Payer: Self-pay

## 2017-11-01 ENCOUNTER — Other Ambulatory Visit: Payer: Self-pay

## 2017-11-01 ENCOUNTER — Observation Stay (HOSPITAL_COMMUNITY)
Admission: EM | Admit: 2017-11-01 | Discharge: 2017-11-08 | Disposition: A | Discharge status: Home / Self Care | Payer: Medicare HMO | Attending: Family Medicine | Admitting: Family Medicine

## 2017-11-01 ENCOUNTER — Emergency Department (HOSPITAL_COMMUNITY): Payer: Medicare HMO

## 2017-11-01 DIAGNOSIS — N390 Urinary tract infection, site not specified: Secondary | ICD-10-CM | POA: Insufficient documentation

## 2017-11-01 DIAGNOSIS — Z880 Allergy status to penicillin: Secondary | ICD-10-CM | POA: Diagnosis not present

## 2017-11-01 DIAGNOSIS — F0390 Unspecified dementia without behavioral disturbance: Secondary | ICD-10-CM | POA: Insufficient documentation

## 2017-11-01 DIAGNOSIS — N179 Acute kidney failure, unspecified: Secondary | ICD-10-CM | POA: Diagnosis not present

## 2017-11-01 DIAGNOSIS — B9689 Other specified bacterial agents as the cause of diseases classified elsewhere: Secondary | ICD-10-CM | POA: Diagnosis not present

## 2017-11-01 DIAGNOSIS — R6510 Systemic inflammatory response syndrome (SIRS) of non-infectious origin without acute organ dysfunction: Principal | ICD-10-CM

## 2017-11-01 DIAGNOSIS — R627 Adult failure to thrive: Secondary | ICD-10-CM | POA: Diagnosis not present

## 2017-11-01 DIAGNOSIS — E860 Dehydration: Secondary | ICD-10-CM | POA: Diagnosis not present

## 2017-11-01 DIAGNOSIS — Z79899 Other long term (current) drug therapy: Secondary | ICD-10-CM | POA: Insufficient documentation

## 2017-11-01 DIAGNOSIS — R531 Weakness: Secondary | ICD-10-CM

## 2017-11-01 LAB — COMPREHENSIVE METABOLIC PANEL
ALBUMIN: 3.6 g/dL (ref 3.5–5.0)
ALK PHOS: 87 U/L (ref 38–126)
ALT: 59 U/L — ABNORMAL HIGH (ref 0–44)
AST: 63 U/L — ABNORMAL HIGH (ref 15–41)
Anion gap: 11 (ref 5–15)
BILIRUBIN TOTAL: 0.8 mg/dL (ref 0.3–1.2)
BUN: 27 mg/dL — ABNORMAL HIGH (ref 8–23)
CALCIUM: 9 mg/dL (ref 8.9–10.3)
CO2: 29 mmol/L (ref 22–32)
CREATININE: 0.92 mg/dL (ref 0.44–1.00)
Chloride: 102 mmol/L (ref 98–111)
GFR calc Af Amer: >60 mL/min (ref >60)
GFR calc non Af Amer: >60 mL/min (ref >60)
GLUCOSE: 125 mg/dL — AB (ref 70–99)
Potassium: 4.1 mmol/L (ref 3.5–5.1)
Sodium: 142 mmol/L (ref 135–145)
TOTAL PROTEIN: 7.6 g/dL (ref 6.5–8.1)

## 2017-11-01 LAB — CBC WITH DIFFERENTIAL/PLATELET
Basophils Absolute: 0 10*3/uL (ref 0.0–0.1)
Basophils Relative: 0 %
Eosinophils Absolute: 0.1 10*3/uL (ref 0.0–0.7)
Eosinophils Relative: 1 %
HCT: 42.3 % (ref 36.0–46.0)
Hemoglobin: 13.7 g/dL (ref 12.0–15.0)
Lymphocytes Relative: 15 %
Lymphs Abs: 2.4 10*3/uL (ref 0.7–4.0)
MCH: 30.1 pg (ref 26.0–34.0)
MCHC: 32.4 g/dL (ref 30.0–36.0)
MCV: 93 fL (ref 78.0–100.0)
Monocytes Absolute: 1.3 10*3/uL — ABNORMAL HIGH (ref 0.1–1.0)
Monocytes Relative: 8 %
Neutro Abs: 11.7 10*3/uL — ABNORMAL HIGH (ref 1.7–7.7)
Neutrophils Relative %: 76 %
Platelets: 357 10*3/uL (ref 150–400)
RBC: 4.55 MIL/uL (ref 3.87–5.11)
RDW: 13 % (ref 11.5–15.5)
WBC: 15.5 10*3/uL — ABNORMAL HIGH (ref 4.0–10.5)

## 2017-11-01 LAB — TROPONIN I
Troponin I: <0.03 ng/mL (ref <0.03)
Troponin I: <0.03 ng/mL (ref <0.03)

## 2017-11-01 LAB — URINALYSIS, ROUTINE W REFLEX MICROSCOPIC
Bilirubin Urine: NEGATIVE
Glucose, UA: NEGATIVE mg/dL
Ketones, ur: NEGATIVE mg/dL
Nitrite: NEGATIVE
Protein, ur: 30 mg/dL — AB
Specific Gravity, Urine: 1.021 (ref 1.005–1.030)
pH: 5 (ref 5.0–8.0)

## 2017-11-01 LAB — I-STAT CG4 LACTIC ACID, ED
Lactic Acid, Venous: 2.07 mmol/L (ref 0.5–1.9)
Lactic Acid, Venous: 0.55 mmol/L (ref 0.5–1.9)

## 2017-11-01 MED ORDER — MELATONIN 3 MG PO TABS
3.0000 mg | ORAL_TABLET | Freq: Every day | ORAL | Status: DC
  Administered 2017-11-01 – 2017-11-07 (×7): 3 mg via ORAL
  Filled 2017-11-01 (×7): qty 1

## 2017-11-01 MED ORDER — SODIUM CHLORIDE 0.9 % IV SOLN
1.0000 g | INTRAVENOUS | Status: DC
  Administered 2017-11-02 – 2017-11-05 (×4): 1 g via INTRAVENOUS
  Filled 2017-11-01 (×4): qty 1
  Filled 2017-11-01: qty 10

## 2017-11-01 MED ORDER — DORZOLAMIDE HCL-TIMOLOL MAL 2-0.5 % OP SOLN
1.0000 [drp] | Freq: Two times a day (BID) | OPHTHALMIC | Status: DC
  Filled 2017-11-01: qty 10

## 2017-11-01 MED ORDER — BAZA PROTECT EX CREA: TOPICAL_CREAM | CUTANEOUS | Status: DC

## 2017-11-01 MED ORDER — TOBRAMYCIN-DEXAMETHASONE 0.3-0.1 % OP SUSP
1.0000 [drp] | OPHTHALMIC | Status: DC
  Administered 2017-11-02 – 2017-11-08 (×28): 1 [drp] via OPHTHALMIC
  Filled 2017-11-01 (×2): qty 2.5

## 2017-11-01 MED ORDER — DORZOLAMIDE HCL-TIMOLOL MAL 2-0.5 % OP SOLN
1.0000 [drp] | Freq: Two times a day (BID) | OPHTHALMIC | Status: AC
  Administered 2017-11-02 – 2017-11-07 (×10): 1 [drp] via OPHTHALMIC
  Filled 2017-11-01 (×2): qty 10

## 2017-11-01 MED ORDER — ZINC OXIDE 40 % EX OINT
TOPICAL_OINTMENT | Freq: Three times a day (TID) | CUTANEOUS | Status: DC
  Administered 2017-11-02 – 2017-11-05 (×12): via TOPICAL
  Administered 2017-11-06: 1 via TOPICAL
  Administered 2017-11-06 – 2017-11-07 (×3): via TOPICAL
  Administered 2017-11-07: 1 via TOPICAL
  Administered 2017-11-07: 10:00:00 via TOPICAL
  Administered 2017-11-08: 1 via TOPICAL
  Filled 2017-11-01: qty 57

## 2017-11-01 MED ORDER — ENOXAPARIN SODIUM 40 MG/0.4ML ~~LOC~~ SOLN
40.0000 mg | SUBCUTANEOUS | Status: DC
  Administered 2017-11-01 – 2017-11-07 (×7): 40 mg via SUBCUTANEOUS
  Filled 2017-11-01 (×5): qty 0.4

## 2017-11-01 MED ORDER — ENSURE ENLIVE PO LIQD
237.0000 mL | Freq: Two times a day (BID) | ORAL | Status: DC
  Administered 2017-11-02 – 2017-11-08 (×10): 237 mL via ORAL

## 2017-11-01 MED ORDER — SODIUM CHLORIDE 0.9 % IV SOLN
INTRAVENOUS | Status: DC
  Administered 2017-11-01 – 2017-11-04 (×4): via INTRAVENOUS

## 2017-11-01 MED ORDER — ACETAMINOPHEN 500 MG PO TABS
500.0000 mg | ORAL_TABLET | Freq: Four times a day (QID) | ORAL | Status: DC | PRN
  Administered 2017-11-01: 500 mg via ORAL
  Filled 2017-11-01: qty 1

## 2017-11-01 MED ORDER — SODIUM CHLORIDE 0.9 % IV BOLUS
2000.0000 mL | Freq: Once | INTRAVENOUS | Status: AC
  Administered 2017-11-01: 2000 mL via INTRAVENOUS

## 2017-11-01 NOTE — Discharge Instructions (Signed)
We saw Ms. Noris in the emergency room for weakness. The lab work-up in the ER does not show any concerning findings.  Her urine looks clean.  She has been responding to our verbal cues.  We have consulted the hospitalist who has cleared the patient from medical perspective.  We think that patient needs daily physical therapy evaluation for at least the next 7 to 10 days that she needs to get stronger. Please also have her reassessed by the medical staff at the facility as soon as possible,

## 2017-11-01 NOTE — ED Provider Notes (Signed)
Pt d/w Dr. Blake Divine ([facility]) who will admit for observation tonight.   Jacalyn Lefevre, MD 11/01/17 [phone]

## 2017-11-01 NOTE — ED Notes (Signed)
Patient is unresponsive and unable to ambulate.

## 2017-11-01 NOTE — ED Notes (Signed)
Ed provider Nanavati notified patient has critical lactic acid value of 2.07

## 2017-11-01 NOTE — ED Notes (Signed)
Bed: WA08 Expected date:  Expected time:  Means of arrival:  Comments: EMS/weakness 

## 2017-11-01 NOTE — ED Provider Notes (Addendum)
[facility]-EMERGENCY DEPT Provider Note   CSN: [id number] Arrival date & time: 11/01/17  1047     History   Chief Complaint Chief Complaint  Patient presents with  . Weakness    HPI Laylanie Kruczek is a 72 y.o. female.  HPI  72 year old female comes in with chief complaint of weakness.  Patient has history of dementia and a recent admission for sepsis secondary to urinary tract infection.  Patient has dementia, level 5 caveat.  According to the nursing home, prior to patient being admitted for sepsis she was able to move around a little bit.  Since her discharge 2 days ago patient has been unable to ambulate.  Furthermore, this morning when patient was set up for breakfast she would slide down.  Patient is not eating or drinking well since being discharged either and she has become wheelchair-bound.  They have not noticed any new fevers, chills, vomiting.  Past Medical History:  Diagnosis Date  . Dementia     Patient Active Problem List   Diagnosis Date Noted  . Pressure injury of skin 10/27/2017  . Sepsis secondary to UTI (HCC) 10/26/2017  . Dementia (HCC) 10/26/2017  . Hyperglycemia 10/26/2017  . Infection of right eye 10/26/2017    Past Surgical History:  Procedure Laterality Date  . GALLBLADDER SURGERY       OB History   None      Home Medications    Prior to Admission medications   Medication Sig Start Date End Date Taking? Authorizing Provider  acetaminophen (TYLENOL) 500 MG tablet Take 500 mg by mouth every 6 (six) hours as needed (pain).    Yes [provider]  cefdinir (OMNICEF) 300 MG capsule Take 1 capsule (300 mg total) by mouth every 12 (twelve) hours. 10/30/17  Yes Ghimire, Werner Lean, MD  dextromethorphan-guaiFENesin (TUSSIN DM) 10-100 MG/5ML liquid Take 10 mLs by mouth every 6 (six) hours as needed for cough.   Yes [provider]  dorzolamide-timolol (COSOPT) 22.3-6.8 MG/ML ophthalmic solution Place 1  drop into the right eye 2 (two) times daily. For 7 days from 10/29/2017 10/30/17  Yes Ghimire, Werner Lean, MD  feeding supplement, ENSURE ENLIVE, (ENSURE ENLIVE) LIQD Take 237 mLs by mouth 2 (two) times daily between meals. 10/30/17  Yes Ghimire, Werner Lean, MD  Melatonin 3 MG TABS Take 3 mg by mouth at bedtime.   Yes [provider]  Skin Protectants, Misc. (BAZA PROTECT EX) Apply 1 application topically See admin instructions. Apply topically to sacral area every shift    Yes [provider]  tobramycin-dexamethasone (TOBRADEX) ophthalmic solution Place 1 drop into the right eye every 4 (four) hours while awake. 10/30/17  Yes Ghimire, Werner Lean, MD  Zinc Oxide (BOUDREAUXS BUTT PASTE EX) Apply 1 application topically 3 (three) times daily. Apply 1 application to buttocks and posterior things   Yes [provider]  Hydrocortisone (GERHARDT'S BUTT CREAM) CREA Apply 1 application topically 3 (three) times daily. Apply in a thin layer to buttocks, perineal area and medial and posterior thighs following cleansing of same with house skin cleanser. Patient not taking: Reported on 11/01/2017 10/30/17   Maretta Bees, MD    Family History Family History  Problem Relation Age of Onset  . Stroke Mother     Social History Social History   Tobacco Use  . Smoking status: Never Smoker  . Smokeless tobacco: Never Used  Substance Use Topics  . Alcohol use: No  . Drug use:  Not on file     Allergies   Penicillins   Review of Systems Review of Systems  Unable to perform ROS: Dementia     Physical Exam Updated Vital Signs BP (!) 158/144   Pulse 77   Temp 98.1 F (36.7 C) (Oral)   Resp 16   SpO2 99%   Physical Exam  Constitutional: She appears well-nourished.  HENT:  Head: Atraumatic.  Eyes: Pupils are equal, round, and reactive to light. EOM are normal.  Right eye has mild chemosis  Neck: Neck supple.  Cardiovascular: Normal rate.  Pulmonary/Chest: Effort  normal. No respiratory distress. She has no wheezes.  Abdominal: Soft. There is no tenderness.  Neurological:  Arousable to sternal rub initially.  At reassessment patient is now arousable to verbal stimuli  Skin: Skin is warm.  Nursing note and vitals reviewed.    ED Treatments / Results  Labs (all labs ordered are listed, but only abnormal results are displayed) Labs Reviewed  COMPREHENSIVE METABOLIC PANEL - Abnormal; Notable for the following components:      Result Value   Glucose, Bld 125 (*)    BUN 27 (*)    AST 63 (*)    ALT 59 (*)    All other components within normal limits  CBC WITH DIFFERENTIAL/PLATELET - Abnormal; Notable for the following components:   WBC 15.5 (*)    Neutro Abs 11.7 (*)    Monocytes Absolute 1.3 (*)    All other components within normal limits  URINALYSIS, ROUTINE W REFLEX MICROSCOPIC - Abnormal; Notable for the following components:   APPearance HAZY (*)    Hgb urine dipstick SMALL (*)    Protein, ur 30 (*)    Leukocytes, UA SMALL (*)    Bacteria, UA RARE (*)    All other components within normal limits  I-STAT CG4 LACTIC ACID, ED - Abnormal; Notable for the following components:   Lactic Acid, Venous 2.07 (*)    All other components within normal limits  URINE CULTURE  TROPONIN I  TROPONIN I  I-STAT CG4 LACTIC ACID, ED    EKG EKG Interpretation  Date/Time:  Friday November 01 2017 10:59:33 EDT Ventricular Rate:  81 PR Interval:    QRS Duration: 83 QT Interval:  366 QTC Calculation: 425 R Axis:   -26 Text Interpretation:  Sinus rhythm Borderline left axis deviation Borderline T wave abnormalities No acute changes No significant change since last tracing Confirmed by Derwood Kaplan ([id number]) on 11/01/2017 11:40:41 AM   Radiology Dg Chest Port 1 View  Result Date: 11/01/2017 CLINICAL DATA:  Cough. EXAM: PORTABLE CHEST 1 VIEW COMPARISON:  Radiographs of October 26, 2017. FINDINGS: The heart size and mediastinal contours are within  normal limits. Both lungs are clear. The visualized skeletal structures are unremarkable. IMPRESSION: No acute cardiopulmonary abnormality seen. Electronically Signed   By: Lupita Raider, M.D.   On: 11/01/2017 11:33    Procedures Procedures (including critical care time)  Medications Ordered in ED Medications  sodium chloride 0.9 % bolus 2,000 mL (0 mLs Intravenous Stopped 11/01/17 1508)     Initial Impression / Assessment and Plan / ED Course  I have reviewed the triage vital signs and the nursing notes.  Pertinent labs & imaging results that were available during my care of the patient were reviewed by me and considered in my medical decision making (see chart for details).     72 year old female comes in with chief complaint of worsening weakness.  She has  history of dementia and was recently admitted for urosepsis.  It appears that since the patient returned to the nursing home 2 days ago, her weakness has progressed.  At baseline patient was able to move around a little bit, however now she has become wheelchair-bound.  Patient is still on Omnicef.  Exam is not pointing to any specific source of infection.  Abdominal exam is benign.  I spoke with the SNF, they are concerned that there could be underlying medical issue causing the weakness -they want patient to be further assessed for admission.  I have called medicine for consultation. There is PT available at SNF, if patient is cleared by medicine team and we are to discharge her, we will request that patient get regular PT assessment.  Patient has also received IV fluid in the ED.  Initial lactate is 2.07.  4:06 PM CT scan from 928 reviewed.  I do not think at this time patient needs a repeat CAT scan, as she is moving all 4 extremities and we are not concerned for an acute bleed.  Final Clinical Impressions(s) / ED Diagnoses   Final diagnoses:  Generalized weakness    ED Discharge Orders    None            Derwood Kaplan, MD 11/01/17 1606    Derwood Kaplan, MD 11/01/17 1606

## 2017-11-01 NOTE — H&P (Signed)
History and Physical    Brittany Compton ZOX:[id number] DOB: 12-Mar-1945 DOA: 11/01/2017  PCP: Almetta Lovely, [facility]  Patient coming from: ALF  I have personally briefly reviewed patient's old medical records in [facility]  Chief Complaint: sent from ALF, because she was not able to sit upright, not following commands? May be more confused than her baseline.   HPI: Brittany Compton is a 72 y.o. female with medical history significant of  Dementia, recently treated for urosepsis and discharged on the 10/2, was brought in by EMS for the above. Pt has advanced dementia, she is laughing to herself and smiling. She is not responding to any questions. She does not appear to be in distress. Called her daughter over the phone and she reported that patient hasn't been eating or doing much since discharge, she had a low grade temp of 99 this am. Today she couldn't sit upright and the staff at the facility felt she is more confused than baseline and sent her to ED for further evaluation.   ED Course: on arrival to ED, she was afebrile, hypotensive. Labs revealed normal troponins, glucose of 125, BUN of 27, elevated AST of 63, ALT OF 59, lactic acid of 2.07, wbc count of 15.5. UA showed small leukocytes and rare bacteria, . CXR does not show any acute findings.  She was referred to medical service for evaluation and management dehydration.  Review of Systems: pt has advanced dementia. ROS cannot be obtained.   Past Medical History:  Diagnosis Date  . Dementia [facility])     Past Surgical History:  Procedure Laterality Date  . GALLBLADDER SURGERY       reports that she has never smoked. She has never used smokeless tobacco. She reports that she does not drink alcohol or use drugs.  Allergies  Allergen Reactions  . Penicillins Other (See Comments)    Unknown reaction Has patient had a PCN reaction causing immediate rash, facial/tongue/throat swelling, SOB or lightheadedness with hypotension:  Unknown Has patient had a PCN reaction causing severe rash involving mucus membranes or skin necrosis: Unknown Has patient had a PCN reaction that required hospitalization: Unknown Has patient had a PCN reaction occurring within the last 10 years: Unknown If all of the above answers are "NO", then may proceed with Cephalosporin use.     Family History  Problem Relation Age of Onset  . Stroke Mother     Pt has dementia , family history couldn't be obtained.   Prior to Admission medications   Medication Sig Start Date End Date Taking? Authorizing Provider  acetaminophen (TYLENOL) 500 MG tablet Take 500 mg by mouth every 6 (six) hours as needed (pain).    Yes [provider]  cefdinir (OMNICEF) 300 MG capsule Take 1 capsule (300 mg total) by mouth every 12 (twelve) hours. 10/30/17  Yes Ghimire, Werner Lean, MD  dextromethorphan-guaiFENesin (TUSSIN DM) 10-100 MG/5ML liquid Take 10 mLs by mouth every 6 (six) hours as needed for cough.   Yes [provider]  dorzolamide-timolol (COSOPT) 22.3-6.8 MG/ML ophthalmic solution Place 1 drop into the right eye 2 (two) times daily. For 7 days from 10/29/2017 10/30/17  Yes Ghimire, Werner Lean, MD  feeding supplement, ENSURE ENLIVE, (ENSURE ENLIVE) LIQD Take 237 mLs by mouth 2 (two) times daily between meals. 10/30/17  Yes Ghimire, Werner Lean, MD  Melatonin 3 MG TABS Take 3 mg by mouth at bedtime.   Yes [provider]  Skin Protectants, Misc. (BAZA PROTECT EX) Apply 1  application topically See admin instructions. Apply topically to sacral area every shift    Yes [provider]  tobramycin-dexamethasone (TOBRADEX) ophthalmic solution Place 1 drop into the right eye every 4 (four) hours while awake. 10/30/17  Yes Ghimire, Werner Lean, MD  Zinc Oxide (BOUDREAUXS BUTT PASTE EX) Apply 1 application topically 3 (three) times daily. Apply 1 application to buttocks and posterior things   Yes [provider]  Hydrocortisone  (GERHARDT'S BUTT CREAM) CREA Apply 1 application topically 3 (three) times daily. Apply in a thin layer to buttocks, perineal area and medial and posterior thighs following cleansing of same with house skin cleanser. Patient not taking: Reported on 11/01/2017 10/30/17   Maretta Bees, MD    Physical Exam: Vitals:   11/01/17 1400 11/01/17 1430 11/01/17 1500 11/01/17 1700  BP: 109/69 (!) 116/97 (!) 158/144 (!) 119/95  Pulse: 78 69 77 77  Resp: 18 17 16  (!) 22  Temp:      TempSrc:      SpO2: 98% 96% 99% 99%    Constitutional:alert, comfortable.  Vitals:   11/01/17 1400 11/01/17 1430 11/01/17 1500 11/01/17 1700  BP: 109/69 (!) 116/97 (!) 158/144 (!) 119/95  Pulse: 78 69 77 77  Resp: 18 17 16  (!) 22  Temp:      TempSrc:      SpO2: 98% 96% 99% 99%   Eyes: PERRL, lids and conjunctivae normal ENMT: Mucous membranes are dry Respiratory: clear to auscultation bilaterally, no wheezing,Normal respiratory effort. No accessory muscle use.  Cardiovascular: Regular rate and rhythm, no murmurs .  Abdomen: soft non tender non distended bowel sounds good.  Musculoskeletal: no clubbing / cyanosis.  Skin: stage 2 pressure ulcer.  Neurologic: pt is alert, but not oriented .  Psychiatric:pt has dementia, calm and comfortable.   Labs on Admission: I have personally reviewed following labs and imaging studies  CBC: Recent Labs  Lab 10/26/17 1601 10/27/17 0615 10/28/17 0527 10/29/17 0442 11/01/17 1154  WBC 12.2* 10.8* 10.6* 9.0 15.5*  NEUTROABS 10.4* 8.1* 7.8* 6.0 11.7*  HGB 14.4 13.1 13.3 12.5 13.7  HCT 45.5 40.4 41.7 38.7 42.3  MCV 93.4 92.4 92.1 91.7 93.0  PLT 303 225 263 250 357   Basic Metabolic Panel: Recent Labs  Lab 10/26/17 1601 10/26/17 2230 10/27/17 0615 10/28/17 0527 10/29/17 0442 11/01/17 1154  NA 138  --  140 139 138 142  K 3.6  --  3.1* 3.1* 3.6 4.1  CL 105  --  107 105 108 102  CO2 25  --  26 27 26 29   GLUCOSE 140*  --  114* 119* 116* 125*  BUN 12  --  7* 9  10 27*  CREATININE 0.75  --  0.67 0.66 0.67 0.92  CALCIUM 8.8*  --  8.1* 8.5* 8.0* 9.0  MG  --  1.9  --   --   --   --   PHOS  --  3.7  --   --   --   --    GFR: CrCl cannot be calculated (Unknown ideal weight.). Liver Function Tests: Recent Labs  Lab 10/26/17 1601 10/27/17 0615 11/01/17 1154  AST 25 20 63*  ALT 17 15 59*  ALKPHOS 89 72 87  BILITOT 0.7 1.0 0.8  PROT 7.3 5.9* 7.6  ALBUMIN 3.5 2.9* 3.6   No results for input(s): LIPASE, AMYLASE in the last 168 hours. No results for input(s): AMMONIA in the last 168 hours. Coagulation Profile: No results for  input(s): INR, PROTIME in the last 168 hours. Cardiac Enzymes: Recent Labs  Lab 11/01/17 1154  TROPONINI <0.03   BNP (last 3 results) No results for input(s): PROBNP in the last 8760 hours. HbA1C: No results for input(s): HGBA1C in the last 72 hours. CBG: No results for input(s): GLUCAP in the last 168 hours. Lipid Profile: No results for input(s): CHOL, HDL, LDLCALC, TRIG, CHOLHDL, LDLDIRECT in the last 72 hours. Thyroid Function Tests: No results for input(s): TSH, T4TOTAL, FREET4, T3FREE, THYROIDAB in the last 72 hours. Anemia Panel: No results for input(s): VITAMINB12, FOLATE, FERRITIN, TIBC, IRON, RETICCTPCT in the last 72 hours. Urine analysis:    Component Value Date/Time   COLORURINE YELLOW 11/01/2017 1155   APPEARANCEUR HAZY (A) 11/01/2017 1155   LABSPEC 1.021 11/01/2017 1155   PHURINE 5.0 11/01/2017 1155   GLUCOSEU NEGATIVE 11/01/2017 1155   HGBUR SMALL (A) 11/01/2017 1155   BILIRUBINUR NEGATIVE 11/01/2017 1155   KETONESUR NEGATIVE 11/01/2017 1155   PROTEINUR 30 (A) 11/01/2017 1155   NITRITE NEGATIVE 11/01/2017 1155   LEUKOCYTESUR SMALL (A) 11/01/2017 1155    Radiological Exams on Admission: Dg Chest Port 1 View  Result Date: 11/01/2017 CLINICAL DATA:  Cough. EXAM: PORTABLE CHEST 1 VIEW COMPARISON:  Radiographs of October 26, 2017. FINDINGS: The heart size and mediastinal contours are within  normal limits. Both lungs are clear. The visualized skeletal structures are unremarkable. IMPRESSION: No acute cardiopulmonary abnormality seen. Electronically Signed   By: Lupita Raider, M.D.   On: 11/01/2017 11:33    EKG:   Assessment/Plan Active Problems:   SIRS (systemic inflammatory response syndrome) (HCC)   SIRS: Admit overnight for observation. Hydrate and trend lactic acid.  Restart antibiotics for UTI,.  Repeat urine cultures pending.  Pt was discharged on oral omnicef, and she was restarted on rocephin.     Advanced Dementia:  Pt calm , without any behavioral abnormalities.   Leukocytosis differential include infection vs dehydration vs reactive.  Repeat CBC in am.    AKI:   creatinine on discharge is  0.6, creat this am is 0.9.  Probably dehydration/ pre renal etiology.  Hydrate and repeat renal parameters in am.    Nutrition: poor oral intake, get dietary consult.   Deconditioning/ failure to thrive: Get PT/ OT evaluation, will needs skilled rehab on discharge.    DVT prophylaxis: lovenox.  Code Status: full code.  Family Communication: discussed with daughter at bedside.  Disposition Plan: pending clinical improvement, PT evaluation.  Consults called: none.  Admission status: obs/ med surg.    Kathlen Mody MD [facility] Pager [phone]  If 7PM-7AM, please contact night-coverage [email or web address] Password [email or web address]  11/01/2017, 5:25 PM

## 2017-11-01 NOTE — ED Triage Notes (Signed)
Patient arrived by EMS from [facility]. Pt c/o weakness. Pt couldn't sit up at breakfast table. Pt has alzheimer's. Pt is unresponsive.

## 2017-11-01 NOTE — ED Notes (Signed)
ED TO INPATIENT HANDOFF REPORT  Name/Age/Gender Brittany Compton 72 y.o. female  Code Status Code Status History    Date Active Date Inactive Code Status Order ID Comments User Context   10/26/2017 2204 10/31/2017 0148 Full Code [id number]  Reubin Milan, MD Inpatient      Home/SNF/Other Nursing Home  Chief Complaint Weakness  Level of Care/Admitting Diagnosis ED Disposition    ED Disposition Condition [place] [729021]  Level of Care: Med-Surg [16]  Diagnosis: SIRS (systemic inflammatory response syndrome) ([place]) [115520]  Admitting Physician: Hosie Poisson [[id number]]  Attending Physician: Hosie Poisson [[id number]]  PT Class (Do Not Modify): Observation [104]  PT Acc Code (Do Not Modify): Observation [10022]       Medical History Past Medical History:  Diagnosis Date  . Dementia (HCC)     Allergies Allergies  Allergen Reactions  . Penicillins Other (See Comments)    Unknown reaction Has patient had a PCN reaction causing immediate rash, facial/tongue/throat swelling, SOB or lightheadedness with hypotension: Unknown Has patient had a PCN reaction causing severe rash involving mucus membranes or skin necrosis: Unknown Has patient had a PCN reaction that required hospitalization: Unknown Has patient had a PCN reaction occurring within the last 10 years: Unknown If all of the above answers are "NO", then may proceed with Cephalosporin use.     IV Location/Drains/Wounds Patient Lines/Drains/Airways Status   Active Line/Drains/Airways    Name:   Placement date:   Placement time:   Site:   Days:   Peripheral IV 11/01/17 Left Antecubital   11/01/17    1204    Antecubital   less than 1   Pressure Injury 10/26/17 Stage II -  Partial thickness loss of dermis presenting as a shallow open ulcer with a red, pink wound bed without slough.   10/26/17    2230     6   Wound / Incision (Open or Dehisced) 11/26/16 Laceration Ear  Right 1/2 [facility]   11/26/16    1835    Ear   340          Labs/Imaging Results for orders placed or performed during the hospital encounter of 11/01/17 (from the past 48 hour(s))  Comprehensive metabolic panel     Status: Abnormal   Collection Time: 11/01/17 11:54 AM  Result Value Ref [place]   Sodium 142 135 - 145 mmol/L   Potassium 4.1 3.5 - 5.1 mmol/L   Chloride 102 98 - 111 mmol/L   CO2 29 22 - 32 mmol/L   Glucose, Bld 125 (H) 70 - 99 mg/dL   BUN 27 (H) 8 - 23 mg/dL   Creatinine, Ser 0.92 0.44 - 1.00 mg/dL   Calcium 9.0 8.9 - 10.3 mg/dL   Total Protein 7.6 6.5 - 8.1 g/dL   Albumin 3.6 3.5 - 5.0 g/dL   AST 63 (H) 15 - 41 U/L   ALT 59 (H) 0 - 44 U/L   Alkaline Phosphatase 87 38 - 126 U/L   Total Bilirubin 0.8 0.3 - 1.2 mg/dL   GFR calc non Af Amer >60 >60 mL/min   GFR calc Af Amer >60 >60 mL/min    Comment: (NOTE) The eGFR has been calculated using the CKD EPI equation. This calculation has not been validated in all clinical situations. eGFR's persistently <60 mL/min signify possible Chronic Kidney Disease.    Anion gap 11 5 - 15    Comment: Performed at [facility], [place] [address] [place], [place] [address]  CBC with Differential     Status: Abnormal   Collection Time: 11/01/17 11:54 AM  Result Value Ref [place]   WBC 15.5 (H) 4.0 - 10.5 K/uL   RBC 4.55 3.87 - 5.11 MIL/uL   Hemoglobin 13.7 12.0 - 15.0 g/dL   HCT 42.3 36.0 - 46.0 %   MCV 93.0 78.0 - 100.0 fL   MCH 30.1 26.0 - 34.0 pg   MCHC 32.4 30.0 - 36.0 g/dL   RDW 13.0 11.5 - 15.5 %   Platelets 357 150 - 400 K/uL   Neutrophils Relative % 76 %   Neutro Abs 11.7 (H) 1.7 - 7.7 K/uL   Lymphocytes Relative 15 %   Lymphs Abs 2.4 0.7 - 4.0 K/uL   Monocytes Relative 8 %   Monocytes Absolute 1.3 (H) 0.1 - 1.0 K/uL   Eosinophils Relative 1 %   Eosinophils Absolute 0.1 0.0 - 0.7 K/uL   Basophils Relative 0 %   Basophils Absolute 0.0 0.0 - 0.1 K/uL    Comment: Performed at [facility], [place] [address] [place], [place] [address]  Troponin I     Status: None   Collection Time: 11/01/17 11:54 AM  Result Value Ref [place]   Troponin I <0.03 <0.03 ng/mL    Comment: Performed at [facility], [place] [address], [place], [place] [address]  Urinalysis, Routine w reflex microscopic     Status: Abnormal   Collection Time: 11/01/17 11:55 AM  Result Value Ref [place]   Color, Urine YELLOW YELLOW   APPearance HAZY (A) CLEAR   Specific Gravity, Urine 1.021 1.005 - 1.030   pH 5.0 5.0 - 8.0   Glucose, UA NEGATIVE NEGATIVE mg/dL   Hgb urine dipstick SMALL (A) NEGATIVE   Bilirubin Urine NEGATIVE NEGATIVE   Ketones, ur NEGATIVE NEGATIVE mg/dL   Protein, ur 30 (A) NEGATIVE mg/dL   Nitrite NEGATIVE NEGATIVE   Leukocytes, UA SMALL (A) NEGATIVE   RBC / HPF 11-20 0 - 5 RBC/hpf   WBC, UA 21-50 0 - 5 WBC/hpf   Bacteria, UA RARE (A) NONE SEEN   Squamous Epithelial / LPF 0-5 0 - 5   WBC Clumps PRESENT    Mucus PRESENT    Hyaline Casts, UA PRESENT     Comment: Performed at [facility], [place] [address], [place], [place] [address]  I-Stat CG4 Lactic Acid, ED     Status: Abnormal   Collection Time: 11/01/17 12:03 PM  Result Value Ref [place]   Lactic Acid, Venous 2.07 (HH) 0.5 - 1.9 mmol/L   Comment NOTIFIED PHYSICIAN   I-Stat CG4 Lactic Acid, ED     Status: None   Collection Time: 11/01/17  5:37 PM  Result Value Ref [place]   Lactic Acid, Venous 0.55 0.5 - 1.9 mmol/L   Dg Chest Port 1 View  Result Date: 11/01/2017 CLINICAL DATA:  Cough. EXAM: PORTABLE CHEST 1 VIEW COMPARISON:  Radiographs of October 26, 2017. FINDINGS: The heart size and mediastinal contours are within normal limits. Both lungs are clear. The visualized skeletal structures are unremarkable. IMPRESSION: No acute cardiopulmonary abnormality seen. Electronically Signed   By: Marijo Conception, M.D.   On: 11/01/2017 11:33    Pending Labs Unresulted Labs (From admission, onward)     Start     Ordered   11/02/17 0500  Lactic acid, plasma  Tomorrow morning,   STAT     11/01/17  1725   11/02/17 0500  CBC  Tomorrow morning,   R     11/01/17 1725   11/02/17 0413  Basic metabolic panel  Tomorrow morning,   R     11/01/17 1725   11/01/17 1508  Troponin I  STAT,   STAT     11/01/17 1507   11/01/17 1113  Urine culture  STAT,   STAT     11/01/17 1113   Signed and Held  HIV antibody (Routine Testing)  Once,   R     Signed and Held   Signed and Held  Creatinine, serum  (enoxaparin (LOVENOX)    CrCl >/= 30 ml/min)  Weekly,   R    Comments:  while on enoxaparin therapy    Signed and Held          Vitals/Pain Today's Vitals   11/01/17 1400 11/01/17 1430 11/01/17 1500 11/01/17 1700  BP: 109/69 (!) 116/97 (!) 158/144 (!) 119/95  Pulse: 78 69 77 77  Resp: 18 17 16  (!) 22  Temp:      TempSrc:      SpO2: 98% 96% 99% 99%    Isolation Precautions No active isolations  Medications Medications  0.9 %  sodium chloride infusion (has no administration in time range)  cefTRIAXone (ROCEPHIN) 1 g in sodium chloride 0.9 % 100 mL IVPB (has no administration in time range)  sodium chloride 0.9 % bolus 2,000 mL (0 mLs Intravenous Stopped 11/01/17 1508)    Mobility non-ambulatory

## 2017-11-01 NOTE — ED Notes (Signed)
Unable to ask patient questions for triage b/c pt is unresponsive due to alzheimer's.

## 2017-11-01 NOTE — ED Notes (Signed)
PATIENT NOT ALERT ENOUGH TO WALK

## 2017-11-02 DIAGNOSIS — F0390 Unspecified dementia without behavioral disturbance: Secondary | ICD-10-CM

## 2017-11-02 DIAGNOSIS — R627 Adult failure to thrive: Secondary | ICD-10-CM

## 2017-11-02 DIAGNOSIS — E860 Dehydration: Secondary | ICD-10-CM | POA: Diagnosis not present

## 2017-11-02 DIAGNOSIS — N390 Urinary tract infection, site not specified: Secondary | ICD-10-CM

## 2017-11-02 DIAGNOSIS — R6510 Systemic inflammatory response syndrome (SIRS) of non-infectious origin without acute organ dysfunction: Secondary | ICD-10-CM | POA: Diagnosis not present

## 2017-11-02 LAB — LACTIC ACID, PLASMA: Lactic Acid, Venous: 0.7 mmol/L (ref 0.5–1.9)

## 2017-11-02 LAB — CBC
HCT: 33.1 % — ABNORMAL LOW (ref 36.0–46.0)
Hemoglobin: 10.6 g/dL — ABNORMAL LOW (ref 12.0–15.0)
MCH: 30 pg (ref 26.0–34.0)
MCHC: 32 g/dL (ref 30.0–36.0)
MCV: 93.8 fL (ref 78.0–100.0)
PLATELETS: 276 10*3/uL (ref 150–400)
RBC: 3.53 MIL/uL — ABNORMAL LOW (ref 3.87–5.11)
RDW: 13 % (ref 11.5–15.5)
WBC: 8.8 10*3/uL (ref 4.0–10.5)

## 2017-11-02 LAB — BASIC METABOLIC PANEL
Anion gap: 7 (ref 5–15)
BUN: 19 mg/dL (ref 8–23)
CALCIUM: 7.9 mg/dL — AB (ref 8.9–10.3)
CO2: 25 mmol/L (ref 22–32)
CREATININE: 0.55 mg/dL (ref 0.44–1.00)
Chloride: 110 mmol/L (ref 98–111)
GFR calc Af Amer: >60 mL/min (ref >60)
GLUCOSE: 122 mg/dL — AB (ref 70–99)
Potassium: 3.8 mmol/L (ref 3.5–5.1)
Sodium: 142 mmol/L (ref 135–145)

## 2017-11-02 LAB — URINE CULTURE: Culture: NO GROWTH

## 2017-11-02 LAB — HIV ANTIBODY (ROUTINE TESTING W REFLEX): HIV Screen 4th Generation wRfx: NONREACTIVE

## 2017-11-02 NOTE — Clinical Social Work Note (Addendum)
CSW spoke with patient's daughter Brittany Compton, [phone], and she would like CSW to try to find SNF placement for patient.  Patient is Brittany Compton, CSW will begin insurance authorization and bed search in [place].  Patient's clinical information has been faxed and bed search was started in [facility].  CSW to continue to follow patient's progress throughout discharge planning.  [name], MSW, Theresia Majors [phone]  11/02/2017 2:27 PM

## 2017-11-02 NOTE — Clinical Social Work Note (Signed)
Clinical Social Work Assessment  Patient Details  Name: Brittany Compton MRN: [id number] Date of Birth: 1945/07/22  Date of referral:  11/02/17               Reason for consult:  Facility Placement                Permission sought to share information with:  Family Supports, Magazine features editor Permission granted to share information::  Yes, Verbal Permission Granted  Name::     Debrosky,Stefani Daughter [phone]   Agency::  SNF admissions  Relationship::     Contact Information:     Housing/Transportation Living arrangements for the past 2 months:  Skilled Nursing Facility Source of Information:  Adult Children, Medical Team Patient Interpreter Needed:  None Criminal Activity/Legal Involvement Pertinent to Current Situation/Hospitalization:  No - Comment as needed Significant Relationships:  Adult Children Lives with:  Facility Resident Do you feel safe going back to the place where you live?  No Need for family participation in patient care:  Yes (Comment)  Care giving concerns:  Patient's daughter feels that patient needs short term rehab and then transition to long term care.   Social Worker assessment / plan:  Patient is a 72 year old female who has dementia, patient is alert and oriented x1.  Assessment completed by speaking with patient's daughter.  Patient has been living at an ALF for a couple of days, due to insurance denying her for SNF placement last admission.  Patient's daughter felt like patient needs more care then can be provided at ALF.  She would like CSW to try to find placement at SNF, and would like to try for rehab then transition to long term care.  Patient's daughter is aware that patient may not be approved for SNF placement through insurance, but she is willing to pay privately for SNF.  CSW explained process for going to SNF and how insurance will pay for stay.  CSW was given permission to begin bed search in [place].  Patient's daughter  did not express any other questions or concerns.  Employment status:  Retired Database administrator PT Recommendations:  Skilled Nursing Facility Information / Referral to community resources:  Skilled Nursing Facility  Patient/Family's Response to care:  Patient's family is in agreement to having patient go to SNF.  Patient/Family's Understanding of and Emotional Response to Diagnosis, Current Treatment, and Prognosis:  Patient's family is disappointed that ALF placement did not work well for her, but she feels that patient would be more appropriate for SNF now.  Emotional Assessment Appearance:  Appears stated age Attitude/Demeanor/Rapport:    Affect (typically observed):  Appropriate Orientation:  Oriented to Self Alcohol / Substance use:  Not Applicable Psych involvement (Current and /or in the community):  No (Comment)  Discharge Needs  Concerns to be addressed:  Care Coordination, Cognitive Concerns Readmission within the last 30 days:  Yes(10-30-17 to ALF) Current discharge risk:  None Barriers to Discharge:  Continued Medical Work up, [employer]   [place] Constable 11/02/2017, 6:26 PM

## 2017-11-02 NOTE — Progress Notes (Deleted)
Physical Therapy Treatment Patient Details Name: Brittany Compton MRN: [id number] DOB: Apr 12, 1945 Today's Date: 11/02/2017    History of Present Illness 72yo female sent to the ED from [facility] facility due to reduced activity/increased lethargy. Her baseline is non-verbal and WC bound, but she is very active in her WC. Diagnosed with Sepsis secondary to UTI.  PMH dementia     PT Comments    Pt was attempted with regards to getting to side of bed, but is resisting efforts to move her.  Pt is planning for SNF which may be a good LT plan as she is quite unable to assist with mobility currently.  Will follow as pt allows, likely at a later time of day as she is not amenable to early morning, at least for today.  Follow Up Recommendations  SNF     Equipment Recommendations  Other (comment)(defer to SNF)    Recommendations for Other Services       Precautions / Restrictions Precautions Precautions: Fall Precaution Comments: non-verbal and non-ambulatory at baseline  Restrictions Weight Bearing Restrictions: No    Mobility  Bed Mobility Overal bed mobility: Needs Assistance Bed Mobility: Supine to Sit;Sit to Supine Rolling: Total assist   Supine to sit: Total assist Sit to supine: Total assist   General bed mobility comments: actively resisting PT  Transfers                    Ambulation/Gait                 Stairs             Wheelchair Mobility    Modified Rankin (Stroke Patients Only)       Balance                                            Cognition Arousal/Alertness: Awake/alert Behavior During Therapy: Flat affect Overall Cognitive Status: History of cognitive impairments - at baseline                                 General Comments: Pt became verbally aggressive with PT in a nonsense actual word presentation but agitated      Exercises General Exercises - Lower Extremity Ankle Circles/Pumps:  AAROM;Both;5 reps Heel Slides: AAROM;Both;5 reps    General Comments        Pertinent Vitals/Pain Pain Assessment: Faces Pain Score: 0-No pain    Home Living                      Prior Function            PT Goals (current goals can now be found in the care plan section) Acute Rehab PT Goals PT Goal Formulation: Patient unable to participate in goal setting Progress towards PT goals: Not progressing toward goals - comment    Frequency    Min 2X/week      PT Plan Current plan remains appropriate    Co-evaluation              AM-PAC PT "6 Clicks" Daily Activity  Outcome Measure  Difficulty turning over in bed (including adjusting bedclothes, sheets and blankets)?: Unable Difficulty moving from lying on back to sitting on the side of the bed? : Unable Difficulty sitting down  on and standing up from a chair with arms (e.g., wheelchair, bedside commode, etc,.)?: Unable Help needed moving to and from a bed to chair (including a wheelchair)?: Total Help needed walking in hospital room?: Total Help needed climbing 3-5 steps with a railing? : Total 6 Click Score: 6    End of Session   Activity Tolerance: Treatment limited secondary to agitation Patient left: in bed;with call bell/phone within reach;with bed alarm set Nurse Communication: Mobility status PT Visit Diagnosis: Muscle weakness (generalized) (M62.81)     Time: 1610-9604 PT Time Calculation (min) (ACUTE ONLY): 21 min  Charges:  $Therapeutic Activity: 8-22 mins                    Ivar Drape 11/02/2017, 9:05 AM   Samul Dada, PT MS Acute Rehab Dept. Number: [facility] [phone] and [facility] [phone]

## 2017-11-02 NOTE — Progress Notes (Signed)
PROGRESS NOTE  Brittany Compton  ZOX:[id number] DOB: [birth year] DOA: 11/01/2017 PCP: Almetta Lovely, [facility]   Brief Narrative: Brittany Compton is a 72 y.o. female with a history of dementia and recent admission for urosepsis who was sent to the ED by ALF due to diminished responsiveness in the setting of decreased po intake. She had been taking antibiotics as prescribed per susceptibility data on urine culture and had no evidence of worsening infection clinically but did appear dehydrated. She was brought in for observation, given IV fluids with normalization of WBC, creatinine, and lactic acid. She was continued on 3rd generation cephalosporin and repeat urine culture showed no growth. She is medically stable for discharge, though the medical team and physical therapy feel that she would require a skilled nursing facility level of care currently and would benefit from daily physical therapy for rehabilitation. This was similarly suspected during her last admission.  Assessment & Plan: Active Problems:   SIRS (systemic inflammatory response syndrome) (HCC)  SIRS: Not sepsis. Abnormalities due to dehydration. No CXR findings, cutaneous infections and urine culture negative.  - Continue IV fluids for now and encourage po intake. She is able to eat and drink but is taking little po.   Dehydration: Improved.  - Continue maintenance IVF's, push po as above - Dietitian consulted  Deconditioning, failure to thrive:  - Needs SNF level of care as she will benefit from PT  AKI: Resolved. - Continue maintenance IVF, avoid nephrotoxins.   UTI: P. stuartii Dx at recent admission sensitive to CTX, augmentin/zosyn, and imipenem. Repeat Cx negative.  - Complete course of abx  Advanced dementia:  - Supportive measures - Delirium precautions    DVT prophylaxis: Lovenox Code Status: Full Family Communication: Daughter by phone Disposition Plan: SNF when bed available. Per discussion with CSW, will  need insurance authorization which can't occur until Monday.  Consultants:   CSW  Procedures:   None  Antimicrobials:  CTX   Subjective: Denies pain. No fever. Only says a couple words.  Objective: Vitals:   11/01/17 2009 11/01/17 2054 11/02/17 0417 11/02/17 1310  BP:  139/62 140/74 136/68  Pulse:  66 72 85  Resp:  14 16 16   Temp:  98.2 F (36.8 C) 98.4 F (36.9 C) 99.7 F (37.6 C)  TempSrc:  Oral Oral Oral  SpO2:  93% 96% 100%  Weight: 75.8 kg       Intake/Output Summary (Last 24 hours) at 11/02/2017 1340 Last data filed at 11/02/2017 0930 Gross per 24 hour  Intake 912.55 ml  Output 200 ml  Net 712.55 ml   Filed Weights   11/01/17 2009  Weight: 75.8 kg    Gen: Elderly, frail female in no distress Pulm: Non-labored breathing room air. Clear to auscultation bilaterally.  CV: Regular rate and rhythm. No murmur, rub, or gallop. No JVD, no pedal edema. GI: Abdomen soft, non-tender, non-distended, with normoactive bowel sounds. No organomegaly or masses felt. Ext: Warm, no deformities Skin: No rashes, lesions no ulcers Neuro: Alert not oriented. Not cooperative with exam Psych: Judgement and insight appear impaired.  Data Reviewed: I have personally reviewed following labs and imaging studies  CBC: Recent Labs  Lab 10/26/17 1601 10/27/17 0615 10/28/17 0527 10/29/17 0442 11/01/17 1154 11/02/17 0611  WBC 12.2* 10.8* 10.6* 9.0 15.5* 8.8  NEUTROABS 10.4* 8.1* 7.8* 6.0 11.7*  --   HGB 14.4 13.1 13.3 12.5 13.7 10.6*  HCT 45.5 40.4 41.7 38.7 42.3 33.1*  MCV 93.4 92.4 92.1 91.7 93.0 93.8  PLT 303 225 263 250 357 276   Basic Metabolic Panel: Recent Labs  Lab 10/26/17 2230 10/27/17 0615 10/28/17 0527 10/29/17 0442 11/01/17 1154 11/02/17 0611  NA  --  140 139 138 142 142  K  --  3.1* 3.1* 3.6 4.1 3.8  CL  --  107 105 108 102 110  CO2  --  26 27 26 29 25   GLUCOSE  --  114* 119* 116* 125* 122*  BUN  --  7* 9 10 27* 19  CREATININE  --  0.67 0.66 0.67  0.92 0.55  CALCIUM  --  8.1* 8.5* 8.0* 9.0 7.9*  MG 1.9  --   --   --   --   --   PHOS 3.7  --   --   --   --   --    GFR: CrCl cannot be calculated (Unknown ideal weight.). Liver Function Tests: Recent Labs  Lab 10/26/17 1601 10/27/17 0615 11/01/17 1154  AST 25 20 63*  ALT 17 15 59*  ALKPHOS 89 72 87  BILITOT 0.7 1.0 0.8  PROT 7.3 5.9* 7.6  ALBUMIN 3.5 2.9* 3.6   No results for input(s): LIPASE, AMYLASE in the last 168 hours. No results for input(s): AMMONIA in the last 168 hours. Coagulation Profile: No results for input(s): INR, PROTIME in the last 168 hours. Cardiac Enzymes: Recent Labs  Lab 11/01/17 1154 11/01/17 1856  TROPONINI <0.03 <0.03   BNP (last 3 results) No results for input(s): PROBNP in the last 8760 hours. HbA1C: No results for input(s): HGBA1C in the last 72 hours. CBG: No results for input(s): GLUCAP in the last 168 hours. Lipid Profile: No results for input(s): CHOL, HDL, LDLCALC, TRIG, CHOLHDL, LDLDIRECT in the last 72 hours. Thyroid Function Tests: No results for input(s): TSH, T4TOTAL, FREET4, T3FREE, THYROIDAB in the last 72 hours. Anemia Panel: No results for input(s): VITAMINB12, FOLATE, FERRITIN, TIBC, IRON, RETICCTPCT in the last 72 hours. Urine analysis:    Component Value Date/Time   COLORURINE YELLOW 11/01/2017 1155   APPEARANCEUR HAZY (A) 11/01/2017 1155   LABSPEC 1.021 11/01/2017 1155   PHURINE 5.0 11/01/2017 1155   GLUCOSEU NEGATIVE 11/01/2017 1155   HGBUR SMALL (A) 11/01/2017 1155   BILIRUBINUR NEGATIVE 11/01/2017 1155   KETONESUR NEGATIVE 11/01/2017 1155   PROTEINUR 30 (A) 11/01/2017 1155   NITRITE NEGATIVE 11/01/2017 1155   LEUKOCYTESUR SMALL (A) 11/01/2017 1155   Recent Results (from the past 240 hour(s))  Blood Culture (routine x 2)     Status: None   Collection Time: 10/26/17  4:01 PM  Result Value Ref Range Status   Specimen Description BLOOD RIGHT HAND  Final   Special Requests   Final    BOTTLES DRAWN AEROBIC  AND ANAEROBIC Blood Culture results may not be optimal due to an inadequate volume of blood received in culture bottles   Culture   Final    NO GROWTH 5 DAYS Performed at [facility], [address] [place], [place] [address]    Report Status 10/31/2017 FINAL  Final  Blood Culture (routine x 2)     Status: None   Collection Time: 10/26/17  4:02 PM  Result Value Ref [place] Status   Specimen Description BLOOD LEFT HAND  Final   Special Requests   Final    BOTTLES DRAWN AEROBIC AND ANAEROBIC Blood Culture adequate volume   Culture   Final    NO GROWTH 5 DAYS Performed at [facility], [address] [place], [place] [address]    Report Status 10/31/2017 FINAL  Final  Urine culture     Status: Abnormal   Collection Time: 10/26/17  4:28 PM  Result Value Ref [place] Status   Specimen Description URINE, CATHETERIZED  Final   Special Requests   Final    NONE Performed at [facility], [address], [place], [place] [address]    Culture >=100,000 COLONIES/mL PROVIDENCIA STUARTII (A)  Final   Report Status 10/29/2017 FINAL  Final   Organism ID, Bacteria PROVIDENCIA STUARTII (A)  Final      Susceptibility   Providencia stuartii - MIC*    AMPICILLIN RESISTANT Resistant     CEFAZOLIN RESISTANT Resistant     CEFTRIAXONE <=1 SENSITIVE Sensitive     CIPROFLOXACIN >=4 RESISTANT Resistant     GENTAMICIN RESISTANT Resistant     IMIPENEM 0.5 SENSITIVE Sensitive     NITROFURANTOIN 128 RESISTANT Resistant     TRIMETH/SULFA >=320 RESISTANT Resistant     AMPICILLIN/SULBACTAM <=2 SENSITIVE Sensitive     PIP/TAZO <=4 SENSITIVE Sensitive     * >=100,000 COLONIES/mL PROVIDENCIA STUARTII  MRSA PCR Screening     Status: None   Collection Time: 10/26/17 10:37 PM  Result Value Ref [place] Status   MRSA by PCR NEGATIVE NEGATIVE Final    Comment:        The GeneXpert MRSA Assay (FDA approved for NASAL specimens only), is one component of a comprehensive MRSA  colonization surveillance program. It is not intended to diagnose MRSA infection nor to guide or monitor treatment for MRSA infections. Performed at [facility], [address] [place], [place] [address]   Urine culture     Status: None   Collection Time: 11/01/17 11:55 AM  Result Value Ref Range Status   Specimen Description   Final    URINE, CATHETERIZED Performed at [facility], [address], [place], [place] [address]    Special Requests   Final    NONE Performed at [facility], [address], [place], [place] [address]    Culture   Final    NO GROWTH Performed at [facility], [address], [place], [place] [address]    Report Status 11/02/2017 FINAL  Final      Radiology Studies: Dg Chest Port 1 View  Result Date: 11/01/2017 CLINICAL DATA:  Cough. EXAM: PORTABLE CHEST 1 VIEW COMPARISON:  Radiographs of October 26, 2017. FINDINGS: The heart size and mediastinal contours are within normal limits. Both lungs are clear. The visualized skeletal structures are unremarkable. IMPRESSION: No acute cardiopulmonary abnormality seen. Electronically Signed   By: Lupita Raider, M.D.   On: 11/01/2017 11:33    Scheduled Meds: . dorzolamide-timolol  1 drop Right Eye BID  . enoxaparin (LOVENOX) injection  40 mg Subcutaneous Q24H  . feeding supplement (ENSURE ENLIVE)  237 mL Oral BID BM  . liver oil-zinc oxide   Topical TID  . Melatonin  3 mg Oral QHS  . tobramycin-dexamethasone  1 drop Right Eye Q4H while awake   Continuous Infusions: . sodium chloride 75 mL/hr at 11/02/17 1029  . cefTRIAXone (ROCEPHIN)  IV       LOS: 0 days   Time spent: 25 minutes.  Tyrone Nine, MD [facility] [email or web address] Password [email or web address] 11/02/2017, 1:40 PM

## 2017-11-02 NOTE — [place] FL2 (Signed)
[place] MEDICAID FL2 LEVEL OF CARE SCREENING TOOL     IDENTIFICATION  Patient Name: Brittany Compton Birthdate: July 07, 1945 Sex: female Admission Date (Current Location): 11/01/2017  [facility] and [place] Number:  Producer, television/film/video and Address:  [facility] [place], [address] [place], [place] [address]      Provider Number: [id number]  Attending Physician Name and Address:  Tyrone Nine, MD  Relative Name and Phone Number:  Reymundo Poll, daughter, [phone]    Current Level of Care: Hospital Recommended Level of Care: Skilled Nursing Facility Prior Approval Number:    Date Approved/Denied:   PASRR Number: [id number]  Discharge Plan: SNF    Current Diagnoses: Patient Active Problem List   Diagnosis Date Noted  . SIRS (systemic inflammatory response syndrome) (HCC) 11/01/2017  . Pressure injury of skin 10/27/2017  . Sepsis secondary to UTI (HCC) 10/26/2017  . Dementia (HCC) 10/26/2017  . Hyperglycemia 10/26/2017  . Infection of right eye 10/26/2017    Orientation RESPIRATION BLADDER Height & Weight     Self  Normal Incontinent Weight: 167 lb 1.7 oz (75.8 kg) Height:     BEHAVIORAL SYMPTOMS/MOOD NEUROLOGICAL BOWEL NUTRITION STATUS      Continent Diet(Dysphagia 2 diet)  AMBULATORY STATUS COMMUNICATION OF NEEDS Skin   Extensive Assist Verbally PU Stage and Appropriate Care(PRN dressing change)   PU Stage 2 Dressing: (PRN dressing change)                   Personal Care Assistance Level of Assistance  Bathing, Feeding, Dressing Bathing Assistance: Maximum assistance Feeding assistance: Limited assistance Dressing Assistance: Limited assistance     Functional Limitations Info  Sight, Hearing, Speech Sight Info: Adequate Hearing Info: Adequate Speech Info: Adequate    SPECIAL CARE FACTORS FREQUENCY  PT (By licensed PT), OT (By licensed OT)     PT Frequency: Minumum 5x a week OT Frequency: minimum 3x a week             Contractures Contractures Info: Not present    Additional Factors Info  Code Status, Allergies Code Status Info: Full Allergies Info: Penicillins           Current Medications (11/02/2017):  This is the current hospital active medication list Current Facility-Administered Medications  Medication Dose Route Frequency Provider Last Rate Last Dose  . 0.9 %  sodium chloride infusion   Intravenous Continuous Kathlen Mody, MD 75 mL/hr at 11/02/17 1029    . acetaminophen (TYLENOL) tablet 500 mg  500 mg Oral Q6H PRN Kathlen Mody, MD   500 mg at 11/01/17 2250  . cefTRIAXone (ROCEPHIN) 1 g in sodium chloride 0.9 % 100 mL IVPB  1 g Intravenous Q24H Kathlen Mody, MD      . dorzolamide-timolol (COSOPT) 22.3-6.8 MG/ML ophthalmic solution 1 drop  1 drop Right Eye BID Kathlen Mody, MD   1 drop at 11/02/17 1029  . enoxaparin (LOVENOX) injection 40 mg  40 mg Subcutaneous Q24H Kathlen Mody, MD   40 mg at 11/01/17 2252  . feeding supplement (ENSURE ENLIVE) (ENSURE ENLIVE) liquid 237 mL  237 mL Oral BID BM Kathlen Mody, MD      . liver oil-zinc oxide (DESITIN) 40 % ointment   Topical TID Kathlen Mody, MD      . Melatonin TABS 3 mg  3 mg Oral QHS Kathlen Mody, MD   3 mg at 11/01/17 2250  . tobramycin-dexamethasone (TOBRADEX) ophthalmic suspension 1 drop  1 drop Right Eye Q4H  while awake Kathlen Mody, MD   1 drop at 11/02/17 1031     Discharge Medications: Please see discharge summary for a list of discharge medications.  Relevant Imaging Results:  Relevant Lab Results:   Additional Information SSN: [id number]   [place]

## 2017-11-03 DIAGNOSIS — E860 Dehydration: Secondary | ICD-10-CM | POA: Diagnosis not present

## 2017-11-03 DIAGNOSIS — N390 Urinary tract infection, site not specified: Secondary | ICD-10-CM | POA: Diagnosis not present

## 2017-11-03 DIAGNOSIS — R6510 Systemic inflammatory response syndrome (SIRS) of non-infectious origin without acute organ dysfunction: Secondary | ICD-10-CM | POA: Diagnosis not present

## 2017-11-03 DIAGNOSIS — R627 Adult failure to thrive: Secondary | ICD-10-CM | POA: Diagnosis not present

## 2017-11-03 NOTE — Progress Notes (Signed)
PROGRESS NOTE  Brittany Compton  VQQ:[id number] DOB: 1945/06/02 DOA: 11/01/2017 PCP: Almetta Lovely, [facility]   Brief Narrative: Brittany Compton is a 72 y.o. female with a history of dementia and recent admission for urosepsis who was sent to the ED by ALF due to diminished responsiveness in the setting of decreased po intake. She had been taking antibiotics as prescribed per susceptibility data on urine culture and had no evidence of worsening infection clinically but did appear dehydrated. She was brought in for observation, given IV fluids with normalization of WBC, creatinine, and lactic acid. She was continued on 3rd generation cephalosporin and repeat urine culture showed no growth. She is medically stable for discharge, though the medical team and physical therapy feel that she would require a skilled nursing facility level of care currently and would benefit from daily physical therapy for rehabilitation. This was similarly suspected during her last admission though insurance declined to cover that.  Assessment & Plan: Active Problems:   SIRS (systemic inflammatory response syndrome) (HCC)  SIRS: Not sepsis. Abnormalities due to dehydration. No CXR findings, cutaneous infections and urine culture negative.  - Continue IV fluids for now and encourage po intake. She is able to eat and drink but is taking little po secondary to behavioral disturbance.  Dehydration: Improved.  - Continue maintenance IVF's, push po as above - Dietitian consulted  Deconditioning, failure to thrive:  - Needs SNF level of care as she will benefit from PT. Social work is Education administrator this diligently.  AKI: Resolved. - Continue maintenance IVF, avoid nephrotoxins.   UTI: P. stuartii Dx at recent admission sensitive to CTX, augmentin/zosyn, and imipenem. Repeat Cx negative.  - Complete course of abx with CTX with plans to transition to oral 3rd generation cephalosporin  Advanced dementia:  - Supportive  measures - Delirium precautions  DVT prophylaxis: Lovenox Code Status: Full Family Communication: Daughter by phone Disposition Plan: SNF when bed available. Per discussion with CSW, will need insurance authorization which can't occur until Monday.  Consultants:   CSW  Procedures:   None  Antimicrobials:  CTX 10/5 >>    Subjective: Combative when nearing her for interview or exam.   Objective: Vitals:   11/02/17 1310 11/02/17 2000 11/03/17 0434 11/03/17 1710  BP: 136/68 138/72 140/82 (!) 145/86  Pulse: 85 84 80 83  Resp: 16 12 14 15   Temp: 99.7 F (37.6 C) 99.4 F (37.4 C) 99.2 F (37.3 C) 99 F (37.2 C)  TempSrc: Oral Oral Oral Oral  SpO2: 100% 99% 100% 98%  Weight:        Intake/Output Summary (Last 24 hours) at 11/03/2017 1742 Last data filed at 11/03/2017 1710 Gross per 24 hour  Intake 1280.12 ml  Output 1800 ml  Net -519.88 ml   Filed Weights   11/01/17 2009  Weight: 75.8 kg   Gen: 72 y.o. female in no distress Pulm: Nonlabored breathing room air. Clear. CV: Regular rate and rhythm. No JVD, no dependent edema. GI: Abdomen soft, non-tender, non-distended, with normoactive bowel sounds.  Ext: Warm, no deformities, hands in mittens, no abrasions noted. Skin: No definite rashes, lesions or ulcers on visualized skin.  Neuro: Alert, not oriented. Fixed dilated right pupil with +APD, poorly responsive left pupil. uncooperative with exam but moving all extremities Psych: Judgement and insight appear impaired. Otherwise UTD.  Data Reviewed: I have personally reviewed following labs and imaging studies  CBC: Recent Labs  Lab 10/28/17 0527 10/29/17 0442 11/01/17 1154 11/02/17 0611  WBC 10.6* 9.0  15.5* 8.8  NEUTROABS 7.8* 6.0 11.7*  --   HGB 13.3 12.5 13.7 10.6*  HCT 41.7 38.7 42.3 33.1*  MCV 92.1 91.7 93.0 93.8  PLT 263 250 357 276   Basic Metabolic Panel: Recent Labs  Lab 10/28/17 0527 10/29/17 0442 11/01/17 1154 11/02/17 0611  NA 139 138 142  142  K 3.1* 3.6 4.1 3.8  CL 105 108 102 110  CO2 27 26 29 25   GLUCOSE 119* 116* 125* 122*  BUN 9 10 27* 19  CREATININE 0.66 0.67 0.92 0.55  CALCIUM 8.5* 8.0* 9.0 7.9*   GFR: CrCl cannot be calculated (Unknown ideal weight.). Liver Function Tests: Recent Labs  Lab 11/01/17 1154  AST 63*  ALT 59*  ALKPHOS 87  BILITOT 0.8  PROT 7.6  ALBUMIN 3.6   Urine analysis:    Component Value Date/Time   COLORURINE YELLOW 11/01/2017 1155   APPEARANCEUR HAZY (A) 11/01/2017 1155   LABSPEC 1.021 11/01/2017 1155   PHURINE 5.0 11/01/2017 1155   GLUCOSEU NEGATIVE 11/01/2017 1155   HGBUR SMALL (A) 11/01/2017 1155   BILIRUBINUR NEGATIVE 11/01/2017 1155   KETONESUR NEGATIVE 11/01/2017 1155   PROTEINUR 30 (A) 11/01/2017 1155   NITRITE NEGATIVE 11/01/2017 1155   LEUKOCYTESUR SMALL (A) 11/01/2017 1155   Recent Results (from the past 240 hour(s))  Blood Culture (routine x 2)     Status: None   Collection Time: 10/26/17  4:01 PM  Result Value Ref Range Status   Specimen Description BLOOD RIGHT HAND  Final   Special Requests   Final    BOTTLES DRAWN AEROBIC AND ANAEROBIC Blood Culture results may not be optimal due to an inadequate volume of blood received in culture bottles   Culture   Final    NO GROWTH 5 DAYS Performed at [facility], [address], [place], [place] [address]    Report Status 10/31/2017 FINAL  Final  Blood Culture (routine x 2)     Status: None   Collection Time: 10/26/17  4:02 PM  Result Value Ref Range Status   Specimen Description BLOOD LEFT HAND  Final   Special Requests   Final    BOTTLES DRAWN AEROBIC AND ANAEROBIC Blood Culture adequate volume   Culture   Final    NO GROWTH 5 DAYS Performed at [facility], [address], [place], [place] [address]    Report Status 10/31/2017 FINAL  Final  Urine culture     Status: Abnormal   Collection Time: 10/26/17  4:28 PM  Result Value Ref Range Status   Specimen Description URINE, CATHETERIZED  Final    Special Requests   Final    NONE Performed at [facility], [address], [place], [place] [address]    Culture >=100,000 COLONIES/mL PROVIDENCIA STUARTII (A)  Final   Report Status 10/29/2017 FINAL  Final   Organism ID, Bacteria PROVIDENCIA STUARTII (A)  Final      Susceptibility   Providencia stuartii - MIC*    AMPICILLIN RESISTANT Resistant     CEFAZOLIN RESISTANT Resistant     CEFTRIAXONE <=1 SENSITIVE Sensitive     CIPROFLOXACIN >=4 RESISTANT Resistant     GENTAMICIN RESISTANT Resistant     IMIPENEM 0.5 SENSITIVE Sensitive     NITROFURANTOIN 128 RESISTANT Resistant     TRIMETH/SULFA >=320 RESISTANT Resistant     AMPICILLIN/SULBACTAM <=2 SENSITIVE Sensitive     PIP/TAZO <=4 SENSITIVE Sensitive     * >=100,000 COLONIES/mL PROVIDENCIA STUARTII  MRSA PCR Screening     Status: None   Collection Time: 10/26/17 10:37 PM  Result Value Ref Range Status   MRSA by PCR NEGATIVE NEGATIVE Final    Comment:        The GeneXpert MRSA Assay (FDA approved for NASAL specimens only), is one component of a comprehensive MRSA colonization surveillance program. It is not intended to diagnose MRSA infection nor to guide or monitor treatment for MRSA infections. Performed at [facility], [address], [place], [place] [address]   Urine culture     Status: None   Collection Time: 11/01/17 11:55 AM  Result Value Ref Range Status   Specimen Description   Final    URINE, CATHETERIZED Performed at [facility], [address], [place], [place] [address]    Special Requests   Final    NONE Performed at [facility], [address], [place], [place] [address]    Culture   Final    NO GROWTH Performed at [facility], [address], [place], [place] [address]    Report Status 11/02/2017 FINAL  Final      Radiology Studies: No results found.  Scheduled Meds: . dorzolamide-timolol  1 drop Right Eye BID  . enoxaparin (LOVENOX)  injection  40 mg Subcutaneous Q24H  . feeding supplement (ENSURE ENLIVE)  237 mL Oral BID BM  . liver oil-zinc oxide   Topical TID  . Melatonin  3 mg Oral QHS  . tobramycin-dexamethasone  1 drop Right Eye Q4H while awake   Continuous Infusions: . sodium chloride 75 mL/hr at 11/03/17 1237  . cefTRIAXone (ROCEPHIN)  IV 1 g (11/03/17 1718)     LOS: 0 days   Time spent: 25 minutes.  Tyrone Nine, MD [facility] [email or web address] Password [email or web address] 11/03/2017, 5:42 PM

## 2017-11-04 DIAGNOSIS — R627 Adult failure to thrive: Secondary | ICD-10-CM | POA: Diagnosis not present

## 2017-11-04 DIAGNOSIS — E860 Dehydration: Secondary | ICD-10-CM | POA: Diagnosis not present

## 2017-11-04 DIAGNOSIS — R6510 Systemic inflammatory response syndrome (SIRS) of non-infectious origin without acute organ dysfunction: Secondary | ICD-10-CM | POA: Diagnosis not present

## 2017-11-04 DIAGNOSIS — N390 Urinary tract infection, site not specified: Secondary | ICD-10-CM | POA: Diagnosis not present

## 2017-11-04 NOTE — Discharge Summary (Signed)
Physician Discharge Summary  Brittany Compton ZOX:[id number] DOB: [birth year] DOA: 11/01/2017  PCP: Almetta Lovely[facility]  Admit date: 11/01/2017 Discharge date: 11/04/2017  Admitted From: Home Disposition: SNF   Recommendations for Outpatient Follow-up:  1. Follow up with PCP ([facility]) in 1-2 weeks  Home Health: N/A Equipment/Devices: Per SNF Discharge Condition: Stable CODE STATUS: Full Diet recommendation: Regular  Brief/Interim Summary: Brittany Compton is a 72 y.o. female with a history of dementia and recent admission for urosepsis who was sent to the ED by ALF due to diminished responsiveness in the setting of decreased po intake. She had been taking antibiotics as prescribed per susceptibility data on urine culture and had no evidence of worsening infection clinically but did appear dehydrated. She was brought in for observation, given IV fluids with normalization of WBC, creatinine, and lactic acid. She was continued on 3rd generation cephalosporin and repeat urine culture showed no growth. She is medically stable for discharge, though the medical team and physical therapy feel that she would require a skilled nursing facility level of care currently and would benefit from daily physical therapy for rehabilitation. This was similarly suspected during her last admission though insurance declined to cover that.  Discharge Diagnoses:  Active Problems:   SIRS (systemic inflammatory response syndrome) (HCC)  SIRS: Not sepsis. Abnormalities due to dehydration due to poor per oral intake. Obviously did not thrive at home. No CXR findings, cutaneous infections and urine culture negative.  - She is able to eat and drink but is taking little po secondary to behavioral disturbance. Hope this will improve once she's out of the hospital, though should consider palliative consultation if not.  Dehydration: Improved. Continue po hydration. - Dietitian consulted. Protein  supplementation ordered.  Deconditioning, failure to thrive:  - Needs SNF level of care as she will most likely benefit from PT.   AKI: Resolved. - Continue maintenance IVF, avoid nephrotoxins.   UTI: P. stuartii Dx at recent admission sensitive to CTX, augmentin/zosyn, and imipenem. Repeat Cx negative.  - Completed course of abx with CTX (actually 8 total days of effective therapy).   Advanced dementia:  - Supportive measures - Delirium precautions  Discharge Instructions  Allergies as of 11/04/2017      Reactions   Penicillins Other (See Comments)   Unknown reaction Has patient had a PCN reaction causing immediate rash, facial/tongue/throat swelling, SOB or lightheadedness with hypotension: Unknown Has patient had a PCN reaction causing severe rash involving mucus membranes or skin necrosis: Unknown Has patient had a PCN reaction that required hospitalization: Unknown Has patient had a PCN reaction occurring within the last 10 years: Unknown If all of the above answers are "NO", then may proceed with Cephalosporin use.      Medication List    STOP taking these medications   cefdinir 300 MG capsule Commonly known as:  OMNICEF     TAKE these medications   acetaminophen 500 MG tablet Commonly known as:  TYLENOL Take 500 mg by mouth every 6 (six) hours as needed (pain).   BAZA PROTECT EX Apply 1 application topically See admin instructions. Apply topically to sacral area every shift   BOUDREAUXS BUTT PASTE EX Apply 1 application topically 3 (three) times daily. Apply 1 application to buttocks and posterior things   dorzolamide-timolol 22.3-6.8 MG/ML ophthalmic solution Commonly known as:  COSOPT Place 1 drop into the right eye 2 (two) times daily. For 7 days from 10/29/2017   feeding supplement (ENSURE ENLIVE) Liqd Take 237 mLs  by mouth 2 (two) times daily between meals.   Gerhardt's butt cream Crea Apply 1 application topically 3 (three) times daily. Apply in a  thin layer to buttocks, perineal area and medial and posterior thighs following cleansing of same with house skin cleanser.   Melatonin 3 MG Tabs Take 3 mg by mouth at bedtime.   tobramycin-dexamethasone ophthalmic solution Commonly known as:  TOBRADEX Place 1 drop into the right eye every 4 (four) hours while awake.   TUSSIN DM 10-100 MG/5ML liquid Generic drug:  dextromethorphan-guaiFENesin Take 10 mLs by mouth every 6 (six) hours as needed for cough.      Follow-up Information    [facility] Follow up.   Specialty:  Geriatric Medicine Contact information: [address] [place] [address] [phone]          Allergies  Allergen Reactions  . Penicillins Other (See Comments)    Unknown reaction Has patient had a PCN reaction causing immediate rash, facial/tongue/throat swelling, SOB or lightheadedness with hypotension: Unknown Has patient had a PCN reaction causing severe rash involving mucus membranes or skin necrosis: Unknown Has patient had a PCN reaction that required hospitalization: Unknown Has patient had a PCN reaction occurring within the last 10 years: Unknown If all of the above answers are "NO", then may proceed with Cephalosporin use.     Consultations:  None  Procedures/Studies: Dg Chest 2 View  Result Date: 10/26/2017 CLINICAL DATA:  Pt presents for evaluation of decreased activity and lethargy. Lives at [facility], is nonverbal and wheelchair bound at baseline. Facility states LKW last night. Reports drooling to L mouth, leaning more towards L side. Facilit.*comment was truncated* EXAM: CHEST - 2 VIEW COMPARISON:  Radiograph 11/06/2016 FINDINGS: Normal cardiac silhouette. Low lung volumes. Mild bronchitic change. No effusion, infiltrate pneumothorax. Degenerate spurring of spine. IMPRESSION: Mild bronchitic change.  No acute findings.  Low lung volumes. Electronically Signed   By: Genevive Bi M.D.   On: 10/26/2017  16:39   Ct Head Wo Contrast  Result Date: 10/26/2017 CLINICAL DATA:  Altered mental status. EXAM: CT HEAD WITHOUT CONTRAST CT ORBITS WITH CONTRAST TECHNIQUE: Contiguous axial images were obtained from the base of the skull through the vertex without contrast. Multidetector CT imaging of the orbits was performed using the standard protocol with intravenous contrast. CONTRAST:  75mL OMNIPAQUE IOHEXOL 300 MG/ML  SOLN COMPARISON:  Head CT 07/02/2017. FINDINGS: CT HEAD FINDINGS Brain: There is no mass, hemorrhage or extra-axial collection. There is generalized brain atrophy greater than expected at this age. There is no acute or chronic infarction. There is hypoattenuation of the periventricular white matter, most commonly indicating chronic ischemic microangiopathy. Vascular: No abnormal hyperdensity of the major intracranial arteries or dural venous sinuses. No intracranial atherosclerosis. Skull: The visualized skull base, calvarium and extracranial soft tissues are normal. CT ORBITS FINDINGS The orbits examination is severely motion degraded. Orbits: --Globes: Normal. --Bony orbit: Normal. --Preseptal soft tissues: There is a small fluid collection just anterior to the right globe. This may be fluid trapped beneath the eyelid. There is mild right eyelid edema. --Intra- and extraconal orbital fat: Normal. No inflammatory stranding. --Optic nerves: Normal. --Lacrimal glands and fossae: Normal. --Extraocular muscles: Normal. Visualized sinuses:  No fluid levels or advanced mucosal thickening. Soft tissues: Normal. IMPRESSION: 1. No acute intracranial abnormality. 2. 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 3. Severely motion degraded orbital examination. 4. Mild edema of the right eyelid with small underlying fluid collection between the eyelid  and the globe. This may be simple fluid trapped beneath the eyelid. Superficial inflammatory processes such as chalazion are also possibilities. An  abscess within the eyelid is less likely. Direct visualization is recommended. 5. No evidence for orbital (postseptal) cellulitis. Electronically Signed   By: Deatra Robinson M.D.   On: 10/26/2017 18:36   Dg Chest Port 1 View  Result Date: 11/01/2017 CLINICAL DATA:  Cough. EXAM: PORTABLE CHEST 1 VIEW COMPARISON:  Radiographs of October 26, 2017. FINDINGS: The heart size and mediastinal contours are within normal limits. Both lungs are clear. The visualized skeletal structures are unremarkable. IMPRESSION: No acute cardiopulmonary abnormality seen. Electronically Signed   By: Lupita Raider, M.D.   On: 11/01/2017 11:33   Ct Orbits W Contrast  Result Date: 10/26/2017 CLINICAL DATA:  Altered mental status. EXAM: CT HEAD WITHOUT CONTRAST CT ORBITS WITH CONTRAST TECHNIQUE: Contiguous axial images were obtained from the base of the skull through the vertex without contrast. Multidetector CT imaging of the orbits was performed using the standard protocol with intravenous contrast. CONTRAST:  75mL OMNIPAQUE IOHEXOL 300 MG/ML  SOLN COMPARISON:  Head CT 07/02/2017. FINDINGS: CT HEAD FINDINGS Brain: There is no mass, hemorrhage or extra-axial collection. There is generalized brain atrophy greater than expected at this age. There is no acute or chronic infarction. There is hypoattenuation of the periventricular white matter, most commonly indicating chronic ischemic microangiopathy. Vascular: No abnormal hyperdensity of the major intracranial arteries or dural venous sinuses. No intracranial atherosclerosis. Skull: The visualized skull base, calvarium and extracranial soft tissues are normal. CT ORBITS FINDINGS The orbits examination is severely motion degraded. Orbits: --Globes: Normal. --Bony orbit: Normal. --Preseptal soft tissues: There is a small fluid collection just anterior to the right globe. This may be fluid trapped beneath the eyelid. There is mild right eyelid edema. --Intra- and extraconal orbital fat:  Normal. No inflammatory stranding. --Optic nerves: Normal. --Lacrimal glands and fossae: Normal. --Extraocular muscles: Normal. Visualized sinuses:  No fluid levels or advanced mucosal thickening. Soft tissues: Normal. IMPRESSION: 1. No acute intracranial abnormality. 2. 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 3. Severely motion degraded orbital examination. 4. Mild edema of the right eyelid with small underlying fluid collection between the eyelid and the globe. This may be simple fluid trapped beneath the eyelid. Superficial inflammatory processes such as chalazion are also possibilities. An abscess within the eyelid is less likely. Direct visualization is recommended. 5. No evidence for orbital (postseptal) cellulitis. Electronically Signed   By: Deatra Robinson M.D.   On: 10/26/2017 18:36    Subjective: Confused, denies pain.   Discharge Exam: Vitals:   11/03/17 2048 11/04/17 0400  BP: 140/74 138/76  Pulse: 74 70  Resp: 14 16  Temp: 98.9 F (37.2 C) 99 F (37.2 C)  SpO2: 97% 98%   General: Pt is alert, awake, not in acute distress Cardiovascular: RRR, no JVD Respiratory: Clear and nonlabored on room air Abdominal: Soft, NT, ND, bowel sounds + Extremities: No edema, no cyanosis Neuro: Alert, not oriented. No focal deficits though patient not cooperative with exam.  Labs: BNP (last 3 results) No results for input(s): BNP in the last 8760 hours. Basic Metabolic Panel: Recent Labs  Lab 10/29/17 0442 11/01/17 1154 11/02/17 0611  NA 138 142 142  K 3.6 4.1 3.8  CL 108 102 110  CO2 26 29 25   GLUCOSE 116* 125* 122*  BUN 10 27* 19  CREATININE 0.67 0.92 0.55  CALCIUM 8.0* 9.0 7.9*   Liver Function  Tests: Recent Labs  Lab 11/01/17 1154  AST 63*  ALT 59*  ALKPHOS 87  BILITOT 0.8  PROT 7.6  ALBUMIN 3.6   No results for input(s): LIPASE, AMYLASE in the last 168 hours. No results for input(s): AMMONIA in the last 168 hours. CBC: Recent Labs  Lab  10/29/17 0442 11/01/17 1154 11/02/17 0611  WBC 9.0 15.5* 8.8  NEUTROABS 6.0 11.7*  --   HGB 12.5 13.7 10.6*  HCT 38.7 42.3 33.1*  MCV 91.7 93.0 93.8  PLT 250 357 276   Cardiac Enzymes: Recent Labs  Lab 11/01/17 1154 11/01/17 1856  TROPONINI <0.03 <0.03   BNP: Invalid input(s): POCBNP CBG: No results for input(s): GLUCAP in the last 168 hours. D-Dimer No results for input(s): DDIMER in the last 72 hours. Hgb A1c No results for input(s): HGBA1C in the last 72 hours. Lipid Profile No results for input(s): CHOL, HDL, LDLCALC, TRIG, CHOLHDL, LDLDIRECT in the last 72 hours. Thyroid function studies No results for input(s): TSH, T4TOTAL, T3FREE, THYROIDAB in the last 72 hours.  Invalid input(s): FREET3 Anemia work up No results for input(s): VITAMINB12, FOLATE, FERRITIN, TIBC, IRON, RETICCTPCT in the last 72 hours. Urinalysis    Component Value Date/Time   COLORURINE YELLOW 11/01/2017 1155   APPEARANCEUR HAZY (A) 11/01/2017 1155   LABSPEC 1.021 11/01/2017 1155   PHURINE 5.0 11/01/2017 1155   GLUCOSEU NEGATIVE 11/01/2017 1155   HGBUR SMALL (A) 11/01/2017 1155   BILIRUBINUR NEGATIVE 11/01/2017 1155   KETONESUR NEGATIVE 11/01/2017 1155   PROTEINUR 30 (A) 11/01/2017 1155   NITRITE NEGATIVE 11/01/2017 1155   LEUKOCYTESUR SMALL (A) 11/01/2017 1155    Microbiology Recent Results (from the past 240 hour(s))  Blood Culture (routine x 2)     Status: None   Collection Time: 10/26/17  4:01 PM  Result Value Ref Range Status   Specimen Description BLOOD RIGHT HAND  Final   Special Requests   Final    BOTTLES DRAWN AEROBIC AND ANAEROBIC Blood Culture results may not be optimal due to an inadequate volume of blood received in culture bottles   Culture   Final    NO GROWTH 5 DAYS Performed at [facility], [address], [place], [place] [address]    Report Status 10/31/2017 FINAL  Final  Blood Culture (routine x 2)     Status: None   Collection Time: 10/26/17  4:02 PM   Result Value Ref Range Status   Specimen Description BLOOD LEFT HAND  Final   Special Requests   Final    BOTTLES DRAWN AEROBIC AND ANAEROBIC Blood Culture adequate volume   Culture   Final    NO GROWTH 5 DAYS Performed at [facility], [address] [place], [place] [address]    Report Status 10/31/2017 FINAL  Final  Urine culture     Status: Abnormal   Collection Time: 10/26/17  4:28 PM  Result Value Ref Range Status   Specimen Description URINE, CATHETERIZED  Final   Special Requests   Final    NONE Performed at [facility], [address] [place], [place] [address]    Culture >=100,000 COLONIES/mL PROVIDENCIA STUARTII (A)  Final   Report Status 10/29/2017 FINAL  Final   Organism ID, Bacteria PROVIDENCIA STUARTII (A)  Final      Susceptibility   Providencia stuartii - MIC*    AMPICILLIN RESISTANT Resistant     CEFAZOLIN RESISTANT Resistant     CEFTRIAXONE <=1 SENSITIVE  Sensitive     CIPROFLOXACIN >=4 RESISTANT Resistant     GENTAMICIN RESISTANT Resistant     IMIPENEM 0.5 SENSITIVE Sensitive     NITROFURANTOIN 128 RESISTANT Resistant     TRIMETH/SULFA >=320 RESISTANT Resistant     AMPICILLIN/SULBACTAM <=2 SENSITIVE Sensitive     PIP/TAZO <=4 SENSITIVE Sensitive     * >=100,000 COLONIES/mL PROVIDENCIA STUARTII  MRSA PCR Screening     Status: None   Collection Time: 10/26/17 10:37 PM  Result Value Ref Range Status   MRSA by PCR NEGATIVE NEGATIVE Final    Comment:        The GeneXpert MRSA Assay (FDA approved for NASAL specimens only), is one component of a comprehensive MRSA colonization surveillance program. It is not intended to diagnose MRSA infection nor to guide or monitor treatment for MRSA infections. Performed at [facility], [address], [place], [place] [address]   Urine culture     Status: None   Collection Time: 11/01/17 11:55 AM  Result Value Ref Range Status   Specimen Description   Final    URINE, CATHETERIZED Performed  at [facility], [address], [place], [place] [address]    Special Requests   Final    NONE Performed at [facility], [address], [place], [place] [address]    Culture   Final    NO GROWTH Performed at [facility], [address], [place], [place] [address]    Report Status 11/02/2017 FINAL  Final    Time coordinating discharge: Approximately 40 minutes  Tyrone Nine, MD  [facility] 11/04/2017, 2:23 PM Pager [phone]

## 2017-11-05 NOTE — Progress Notes (Signed)
Patient's [employer] denied SNF authorization. CSW discussed placement options with daughter, Hubbard Hartshorn. Daughter wants to private pay for LTC and requested CSW find out availability at facilities. CSW waiting to hear back from facilities regarding their LTC bed availability.   Enid Cutter, MSW, [facility] Clinical Social Work [phone] (Weekend)

## 2017-11-05 NOTE — Progress Notes (Signed)
Patient seen and examined this morning. No changes in vital signs or exam. No acute events overnight, remains appropriate and stable for long term care placement. SNF declined by insurance. Family aware, will select facility today.   Hazeline Junker, MD 11/05/2017 10:47 AM

## 2017-11-06 NOTE — Progress Notes (Signed)
Patient seen and examined this morning. She remains confused but responsive. She is appropriate for long term care and has completed medical treatment of her acute conditions. She remains stable for discharge. Please see discharge summary from 10/7 for full documentation.   Hazeline Junker, MD 11/06/2017 1:05 PM

## 2017-11-06 NOTE — Care Management Obs Status (Addendum)
MEDICARE OBSERVATION STATUS NOTIFICATION   Patient Details  Name: Brittany Compton MRN: [id number] Date of Birth: [birth year]   Medicare Observation Status Notification Given:  Yes  Confused, unable to sign. Copy left in room  Bartholome Bill, RN 11/06/2017, 11:34 AM

## 2017-11-06 NOTE — Progress Notes (Signed)
Physical Therapy Discharge Patient Details Name: Brittany Compton MRN: [id number] DOB: [birth year] Today's Date: 11/06/2017 Time:  -     Patient discharged from PT services secondary to patient is unable to actively participate/ follow for skilled PT intervention at this time. Per MSW, plans to transition to long term SNF. PT will sign off. Please see latest therapy progress note for current level of functioning and progress toward goals.    Progress and discharge plan discussed with MSW.  GP Blanchard Kelch PT Acute Rehabilitation Services Pager [phone] Office [phone]       Rada Hay 11/06/2017, 2:28 PM

## 2017-11-06 NOTE — Progress Notes (Signed)
CSW following to formulate disposition plan- pt's [employer] Medicare denied SNF authorization, however family planning to pay privately for rehab planning to transition to long term care. Spoke with daughter Reymundo Poll this morning- she is touring 3 facilities today- will call CSW back once selection made.  Ilean Skill, MSW, LCSW Clinical Social Work 11/06/2017 [phone]

## 2017-11-07 NOTE — Progress Notes (Signed)
Spoke with pt's daughter re: progressing with DC plan. Of facilities which made preliminary bed offers for pt, none today have openings for long term care private pay beds. [facility] which daughter toured yesterday has potential Corporate investment banker.  Will continue seeking SNF placement.  Ilean Skill, MSW, LCSW Clinical Social Work 11/07/2017 [phone]

## 2017-11-07 NOTE — Progress Notes (Signed)
Patient seen, interviewed and examined this morning. She remains disoriented with inattention and emotional lability. She is responsive but not cooperative. No overnight events reported. There are no further medical treatments required prior to discharge, she has maximized benefit of hospitalization. Please see discharge summary from 10/7 for full documentation of plan. Remains stable for discharge to long term care.  Hazeline Junker, MD 11/07/2017 12:54 PM

## 2017-11-08 LAB — CREATININE, SERUM
Creatinine, Ser: 0.63 mg/dL (ref 0.44–1.00)
GFR calc Af Amer: >60 mL/min (ref >60)
GFR calc non Af Amer: >60 mL/min (ref >60)

## 2017-11-08 NOTE — Clinical Social Work Placement (Signed)
Pt admitting to [facility] SNF report #[phone]. Will arrange PTAR transportation. DC information sent via the HUB. Pt admitting private pay for long term care bed however plans to try to participate in therapy also.   CLINICAL SOCIAL WORK PLACEMENT  NOTE  Date:  11/08/2017  Patient Details  Name: Brittany Compton MRN: [id number] Date of Birth: January 20, 1946  Clinical Social Work is seeking post-discharge placement for this patient at the Skilled  Nursing Facility level of care (*CSW will initial, date and re-position this form in  chart as items are completed):  Yes   Patient/family provided with [place] Clinical Social Work Department's list of facilities offering this level of care within the geographic area requested by the patient (or if unable, by the patient's family).  Yes   Patient/family informed of their freedom to choose among providers that offer the needed level of care, that participate in Medicare, Medicaid or managed care program needed by the patient, have an available bed and are willing to accept the patient.  Yes   Patient/family informed of 's ownership interest in [facility] and [facility], as well as of the fact that they are under no obligation to receive care at these facilities.  PASRR submitted to EDS on 11/02/17     PASRR number received on       Existing PASRR number confirmed on 11/02/17     FL2 transmitted to all facilities in geographic area requested by pt/family on 11/02/17     FL2 transmitted to all facilities within larger geographic area on       Patient informed that his/her managed care company has contracts with or will negotiate with certain facilities, including the following:            Patient/family informed of bed offers received.  Patient chooses bed at       Physician recommends and patient chooses bed at      Patient to be transferred to   on  .  Patient to be transferred to facility by       Patient  family notified on   of transfer.  Name of family member notified:        PHYSICIAN Please sign FL2     Additional Comment:    _______________________________________________ Nelwyn Salisbury, LCSW 11/08/2017, 9:41 AM [phone]

## 2017-11-08 NOTE — Discharge Summary (Signed)
Physician Discharge Summary  Brittany Compton ZOX:[id number] DOB: 1945/06/21 DOA: 11/01/2017  PCP: Almet[facility]  Admit date: 11/01/2017 Discharge date: 11/08/2017  Admitted From: Home Disposition: SNF   Recommendations for Outpatient Follow-up:  1. Follow up with PCP ([facility]) in 1-2 weeks  Home Health: N/A Equipment/Devices: Per SNF Discharge Condition: Stable CODE STATUS: Full Diet recommendation: Regular  Brief/Interim Summary: Brittany Compton is a 72 y.o. female with a history of dementia and recent admission for urosepsis who was sent to the ED by ALF due to diminished responsiveness in the setting of decreased po intake. She had been taking antibiotics as prescribed per susceptibility data on urine culture and had no evidence of worsening infection clinically but did appear dehydrated. She was brought in for observation, given IV fluids with normalization of WBC, creatinine, and lactic acid. She was continued on 3rd generation cephalosporin and repeat urine culture showed no growth. She is medically stable for discharge  Discharge Diagnoses:  Active Problems:   SIRS (systemic inflammatory response syndrome) (HCC)  SIRS: Not sepsis. Abnormalities due to dehydration due to poor per oral intake. Obviously did not thrive at home. No CXR findings, cutaneous infections and urine culture negative.  - She is able to eat and drink but is taking little po secondary to behavioral disturbance. Hope this will improve once she's out of the hospital, though should consider palliative consultation if not.  Dehydration: Improved. Continue po hydration. - Dietitian consulted. Protein supplementation ordered.  Deconditioning, failure to thrive:  - Needs SNF level of care as she will most likely benefit from PT.   AKI: Resolved. - Continue maintenance IVF, avoid nephrotoxins.   UTI: P. stuartii Dx at recent admission sensitive to CTX, augmentin/zosyn, and imipenem.  Repeat Cx negative.  - Completed course of abx with CTX (actually 8 total days of effective therapy).   Advanced dementia:  - Supportive measures - Delirium precautions  Discharge Instructions  Allergies as of 11/08/2017      Reactions   Penicillins Other (See Comments)   Unknown reaction Has patient had a PCN reaction causing immediate rash, facial/tongue/throat swelling, SOB or lightheadedness with hypotension: Unknown Has patient had a PCN reaction causing severe rash involving mucus membranes or skin necrosis: Unknown Has patient had a PCN reaction that required hospitalization: Unknown Has patient had a PCN reaction occurring within the last 10 years: Unknown If all of the above answers are "NO", then may proceed with Cephalosporin use.      Medication List    STOP taking these medications   cefdinir 300 MG capsule Commonly known as:  OMNICEF     TAKE these medications   acetaminophen 500 MG tablet Commonly known as:  TYLENOL Take 500 mg by mouth every 6 (six) hours as needed (pain).   BAZA PROTECT EX Apply 1 application topically See admin instructions. Apply topically to sacral area every shift   BOUDREAUXS BUTT PASTE EX Apply 1 application topically 3 (three) times daily. Apply 1 application to buttocks and posterior things   dorzolamide-timolol 22.3-6.8 MG/ML ophthalmic solution Commonly known as:  COSOPT Place 1 drop into the right eye 2 (two) times daily. For 7 days from 10/29/2017   feeding supplement (ENSURE ENLIVE) Liqd Take 237 mLs by mouth 2 (two) times daily between meals.   Gerhardt's butt cream Crea Apply 1 application topically 3 (three) times daily. Apply in a thin layer to buttocks, perineal area and medial and posterior thighs following cleansing of same with house skin  cleanser.   Melatonin 3 MG Tabs Take 3 mg by mouth at bedtime.   tobramycin-dexamethasone ophthalmic solution Commonly known as:  TOBRADEX Place 1 drop into the right eye  every 4 (four) hours while awake.   TUSSIN DM 10-100 MG/5ML liquid Generic drug:  dextromethorphan-guaiFENesin Take 10 mLs by mouth every 6 (six) hours as needed for cough.      Follow-up Information    [facility] Follow up.   Specialty:  Geriatric Medicine Contact information: [address] [place] [address] [phone]          Allergies  Allergen Reactions  . Penicillins Other (See Comments)    Unknown reaction Has patient had a PCN reaction causing immediate rash, facial/tongue/throat swelling, SOB or lightheadedness with hypotension: Unknown Has patient had a PCN reaction causing severe rash involving mucus membranes or skin necrosis: Unknown Has patient had a PCN reaction that required hospitalization: Unknown Has patient had a PCN reaction occurring within the last 10 years: Unknown If all of the above answers are "NO", then may proceed with Cephalosporin use.     Consultations:  None  Procedures/Studies: Dg Chest 2 View  Result Date: 10/26/2017 CLINICAL DATA:  Pt presents for evaluation of decreased activity and lethargy. Lives at [facility], is nonverbal and wheelchair bound at baseline. Facility states LKW last night. Reports drooling to L mouth, leaning more towards L side. Facilit.*comment was truncated* EXAM: CHEST - 2 VIEW COMPARISON:  Radiograph 11/06/2016 FINDINGS: Normal cardiac silhouette. Low lung volumes. Mild bronchitic change. No effusion, infiltrate pneumothorax. Degenerate spurring of spine. IMPRESSION: Mild bronchitic change.  No acute findings.  Low lung volumes. Electronically Signed   By: Genevive Bi M.D.   On: 10/26/2017 16:39   Ct Head Wo Contrast  Result Date: 10/26/2017 CLINICAL DATA:  Altered mental status. EXAM: CT HEAD WITHOUT CONTRAST CT ORBITS WITH CONTRAST TECHNIQUE: Contiguous axial images were obtained from the base of the skull through the vertex without contrast. Multidetector CT imaging of  the orbits was performed using the standard protocol with intravenous contrast. CONTRAST:  75mL OMNIPAQUE IOHEXOL 300 MG/ML  SOLN COMPARISON:  Head CT 07/02/2017. FINDINGS: CT HEAD FINDINGS Brain: There is no mass, hemorrhage or extra-axial collection. There is generalized brain atrophy greater than expected at this age. There is no acute or chronic infarction. There is hypoattenuation of the periventricular white matter, most commonly indicating chronic ischemic microangiopathy. Vascular: No abnormal hyperdensity of the major intracranial arteries or dural venous sinuses. No intracranial atherosclerosis. Skull: The visualized skull base, calvarium and extracranial soft tissues are normal. CT ORBITS FINDINGS The orbits examination is severely motion degraded. Orbits: --Globes: Normal. --Bony orbit: Normal. --Preseptal soft tissues: There is a small fluid collection just anterior to the right globe. This may be fluid trapped beneath the eyelid. There is mild right eyelid edema. --Intra- and extraconal orbital fat: Normal. No inflammatory stranding. --Optic nerves: Normal. --Lacrimal glands and fossae: Normal. --Extraocular muscles: Normal. Visualized sinuses:  No fluid levels or advanced mucosal thickening. Soft tissues: Normal. IMPRESSION: 1. No acute intracranial abnormality. 2. 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 3. Severely motion degraded orbital examination. 4. Mild edema of the right eyelid with small underlying fluid collection between the eyelid and the globe. This may be simple fluid trapped beneath the eyelid. Superficial inflammatory processes such as chalazion are also possibilities. An abscess within the eyelid is less likely. Direct visualization is recommended. 5. No evidence for orbital (postseptal) cellulitis. Electronically Signed  By: Deatra Robinson M.D.   On: 10/26/2017 18:36   Dg Chest Port 1 View  Result Date: 11/01/2017 CLINICAL DATA:  Cough. EXAM: PORTABLE  CHEST 1 VIEW COMPARISON:  Radiographs of October 26, 2017. FINDINGS: The heart size and mediastinal contours are within normal limits. Both lungs are clear. The visualized skeletal structures are unremarkable. IMPRESSION: No acute cardiopulmonary abnormality seen. Electronically Signed   By: Lupita Raider, M.D.   On: 11/01/2017 11:33   Ct Orbits W Contrast  Result Date: 10/26/2017 CLINICAL DATA:  Altered mental status. EXAM: CT HEAD WITHOUT CONTRAST CT ORBITS WITH CONTRAST TECHNIQUE: Contiguous axial images were obtained from the base of the skull through the vertex without contrast. Multidetector CT imaging of the orbits was performed using the standard protocol with intravenous contrast. CONTRAST:  75mL OMNIPAQUE IOHEXOL 300 MG/ML  SOLN COMPARISON:  Head CT 07/02/2017. FINDINGS: CT HEAD FINDINGS Brain: There is no mass, hemorrhage or extra-axial collection. There is generalized brain atrophy greater than expected at this age. There is no acute or chronic infarction. There is hypoattenuation of the periventricular white matter, most commonly indicating chronic ischemic microangiopathy. Vascular: No abnormal hyperdensity of the major intracranial arteries or dural venous sinuses. No intracranial atherosclerosis. Skull: The visualized skull base, calvarium and extracranial soft tissues are normal. CT ORBITS FINDINGS The orbits examination is severely motion degraded. Orbits: --Globes: Normal. --Bony orbit: Normal. --Preseptal soft tissues: There is a small fluid collection just anterior to the right globe. This may be fluid trapped beneath the eyelid. There is mild right eyelid edema. --Intra- and extraconal orbital fat: Normal. No inflammatory stranding. --Optic nerves: Normal. --Lacrimal glands and fossae: Normal. --Extraocular muscles: Normal. Visualized sinuses:  No fluid levels or advanced mucosal thickening. Soft tissues: Normal. IMPRESSION: 1. No acute intracranial abnormality. 2. 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 3. Severely motion degraded orbital examination. 4. Mild edema of the right eyelid with small underlying fluid collection between the eyelid and the globe. This may be simple fluid trapped beneath the eyelid. Superficial inflammatory processes such as chalazion are also possibilities. An abscess within the eyelid is less likely. Direct visualization is recommended. 5. No evidence for orbital (postseptal) cellulitis. Electronically Signed   By: Deatra Robinson M.D.   On: 10/26/2017 18:36    Subjective: Confused, verbal intermittently Discharge Exam: Vitals:   11/08/17 0715 11/08/17 0842  BP: (!) 142/84   Pulse: 80   Resp: 18 15  Temp: 98.1 F (36.7 C)   SpO2: 97%     eomi ncat in nad cta b abd soft nt nd  Poor dentittion with rotting upper molar Cannot assess for thrush No SM LAN   Labs: BNP (last 3 results) No results for input(s): BNP in the last 8760 hours. Basic Metabolic Panel: Recent Labs  Lab 11/01/17 1154 11/02/17 0611 11/08/17 0405  NA 142 142  --   K 4.1 3.8  --   CL 102 110  --   CO2 29 25  --   GLUCOSE 125* 122*  --   BUN 27* 19  --   CREATININE 0.92 0.55 0.63  CALCIUM 9.0 7.9*  --    Liver Function Tests: Recent Labs  Lab 11/01/17 1154  AST 63*  ALT 59*  ALKPHOS 87  BILITOT 0.8  PROT 7.6  ALBUMIN 3.6   No results for input(s): LIPASE, AMYLASE in the last 168 hours. No results for input(s): AMMONIA in the last 168 hours. CBC: Recent Labs  Lab  11/01/17 1154 11/02/17 0611  WBC 15.5* 8.8  NEUTROABS 11.7*  --   HGB 13.7 10.6*  HCT 42.3 33.1*  MCV 93.0 93.8  PLT 357 276   Cardiac Enzymes: Recent Labs  Lab 11/01/17 1154 11/01/17 1856  TROPONINI <0.03 <0.03   BNP: Invalid input(s): POCBNP CBG: No results for input(s): GLUCAP in the last 168 hours. D-Dimer No results for input(s): DDIMER in the last 72 hours. Hgb A1c No results for input(s): HGBA1C in the last 72 hours. Lipid  Profile No results for input(s): CHOL, HDL, LDLCALC, TRIG, CHOLHDL, LDLDIRECT in the last 72 hours. Thyroid function studies No results for input(s): TSH, T4TOTAL, T3FREE, THYROIDAB in the last 72 hours.  Invalid input(s): FREET3 Anemia work up No results for input(s): VITAMINB12, FOLATE, FERRITIN, TIBC, IRON, RETICCTPCT in the last 72 hours. Urinalysis    Component Value Date/Time   COLORURINE YELLOW 11/01/2017 1155   APPEARANCEUR HAZY (A) 11/01/2017 1155   LABSPEC 1.021 11/01/2017 1155   PHURINE 5.0 11/01/2017 1155   GLUCOSEU NEGATIVE 11/01/2017 1155   HGBUR SMALL (A) 11/01/2017 1155   BILIRUBINUR NEGATIVE 11/01/2017 1155   KETONESUR NEGATIVE 11/01/2017 1155   PROTEINUR 30 (A) 11/01/2017 1155   NITRITE NEGATIVE 11/01/2017 1155   LEUKOCYTESUR SMALL (A) 11/01/2017 1155    Microbiology Recent Results (from the past 240 hour(s))  Urine culture     Status: None   Collection Time: 11/01/17 11:55 AM  Result Value Ref Range Status   Specimen Description   Final    URINE, CATHETERIZED Performed at [facility], [address] [place], [place] [address]    Special Requests   Final    NONE Performed at [facility], [address] [place], [place] [address]    Culture   Final    NO GROWTH Performed at [facility], [address] [place], [place] [address]    Report Status 11/02/2017 FINAL  Final    Time coordinating discharge: Approximately 40 minutes  Rhetta Mura, MD  [facility] 11/08/2017, 9:24 AM Pager [phone]

## 2017-11-21 NOTE — Progress Notes (Signed)
Pt was attempted with regards to getting to side of bed, but is resisting efforts to move her.  Pt is planning for SNF which may be a good LT plan as she is quite unable to assist with mobility currently.  Will follow as pt allows, likely at a later time of day as she is not amenable to early morning, at least for today.    11/02/17 0800  PT Visit Information  Last PT Received On 11/02/17  Assistance Needed +2  History of Present Illness 72yo female sent to the ED from [facility] facility due to reduced activity/increased lethargy. Her baseline is non-verbal and WC bound, but she is very active in her WC. Diagnosed with Sepsis secondary to UTI.  PMH dementia   Subjective Data  Subjective pt is nonverbal  Precautions  Precautions Fall  Precaution Comments non-verbal and non-ambulatory at baseline   Restrictions  Weight Bearing Restrictions No  Pain Assessment  Pain Assessment Faces  Pain Score 0  Cognition  Arousal/Alertness Awake/alert  Behavior During Therapy Flat affect  Overall Cognitive Status History of cognitive impairments - at baseline  General Comments Pt became verbally aggressive with PT in a nonsense actual word presentation but agitated  Bed Mobility  Overal bed mobility Needs Assistance  Bed Mobility Supine to Sit;Sit to Supine  Rolling Total assist  Supine to sit Total assist  Sit to supine Total assist  General bed mobility comments actively resisting PT  Exercises  Exercises General Lower Extremity  General Exercises - Lower Extremity  Ankle Circles/Pumps AAROM;Both;5 reps  Heel Slides AAROM;Both;5 reps  PT - End of Session  Activity Tolerance Treatment limited secondary to agitation  Patient left in bed;with call bell/phone within reach;with bed alarm set  Nurse Communication Mobility status   PT - Assessment/Plan  PT Plan Current plan remains appropriate  PT Visit Diagnosis Muscle weakness (generalized) (M62.81)  PT Frequency (ACUTE ONLY) Min 2X/week   Follow Up Recommendations SNF  PT equipment Other (comment) (defer to SNF)  AM-PAC PT "6 Clicks" Daily Activity Outcome Measure  Difficulty turning over in bed (including adjusting bedclothes, sheets and blankets)? 1  Difficulty moving from lying on back to sitting on the side of the bed?  1  Difficulty sitting down on and standing up from a chair with arms (e.g., wheelchair, bedside commode, etc,.)? 1  Help needed moving to and from a bed to chair (including a wheelchair)? 1  Help needed walking in hospital room? 1  Help needed climbing 3-5 steps with a railing?  1  6 Click Score 6  Mobility G Code  CN  PT Goal Progression  Progress towards PT goals Not progressing toward goals - comment  Acute Rehab PT Goals  PT Goal Formulation Patient unable to participate in goal setting  PT Time Calculation  PT Start Time (ACUTE ONLY) 0839  PT Stop Time (ACUTE ONLY) 0900  PT Time Calculation (min) (ACUTE ONLY) 21 min  PT General Charges  $$ ACUTE PT VISIT 1 Visit  PT Evaluation  $PT Eval Moderate Complexity 1 Mod    Samul Dada, PT MS Acute Rehab Dept. Number: [facility] [id number] and [facility] [phone]

## 2019-06-08 IMAGING — CR DG CHEST 2V
2 series · 2 of 2 positions shown · non-contrast
Comparison: Radiograph 11/06/2016

CLINICAL DATA: Pt presents for evaluation of decreased activity and
lethargy. Lives at [REDACTED], is nonverbal and wheelchair bound at
baseline. Facility states LKW last night. Reports drooling to L
mouth, leaning more towards L side. Facilit...*comment was
truncated*

EXAM:
CHEST - 2 VIEW

[chest lat]
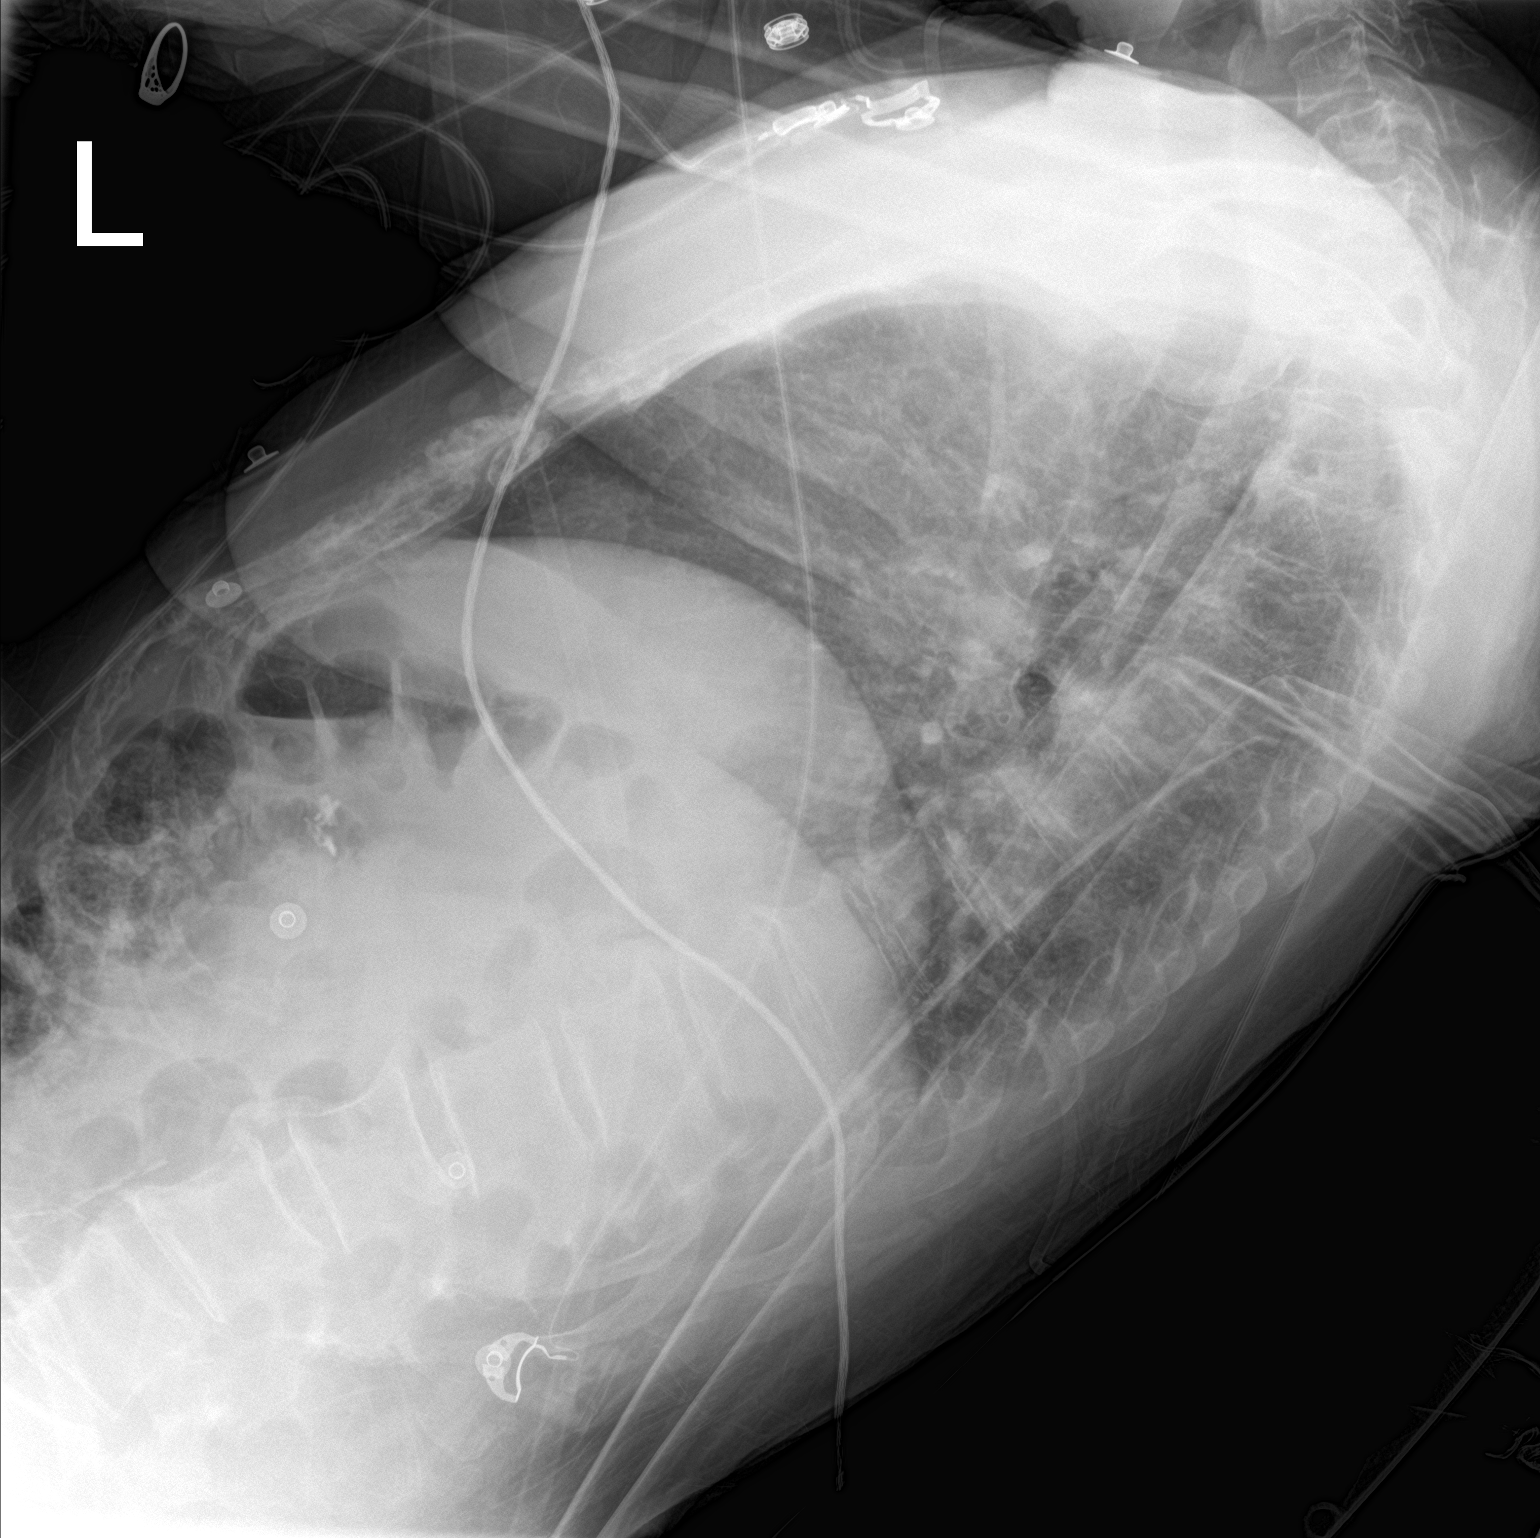

[chest ap]
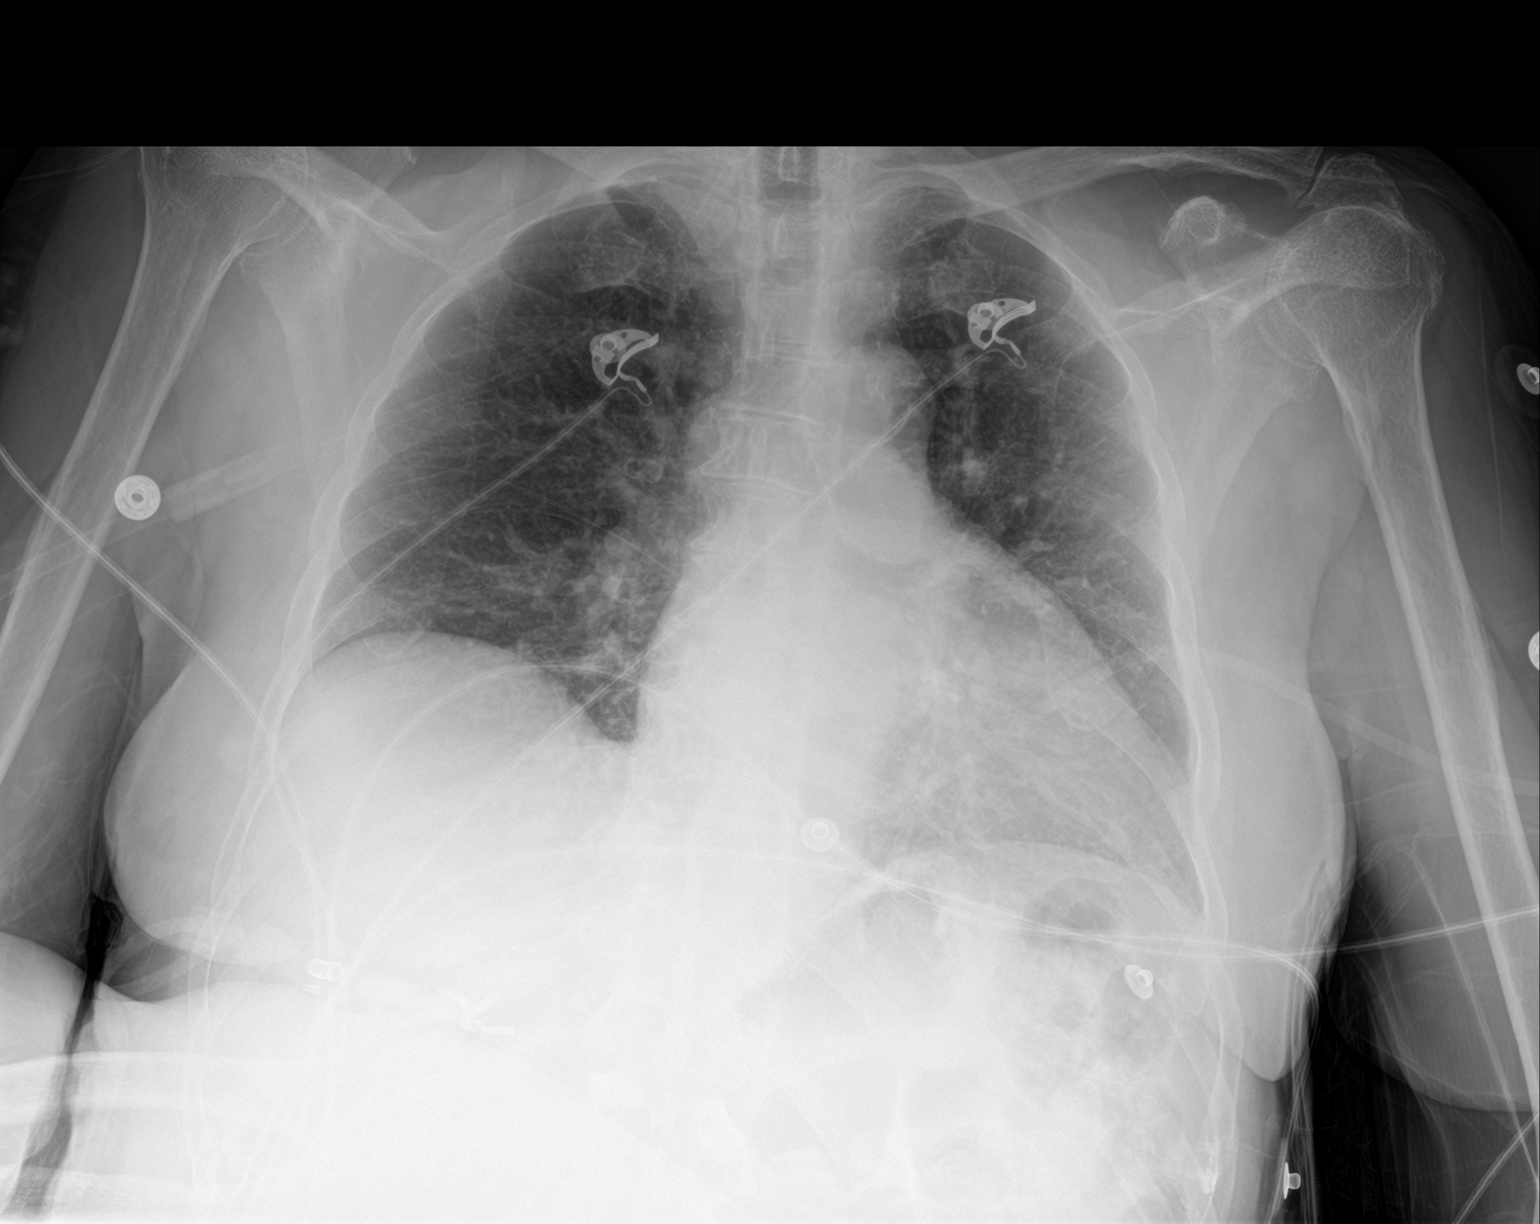

[2 of 2 positions shown; findings below may reference images not displayed]

FINDINGS: Normal cardiac silhouette. Low lung volumes. Mild bronchitic change.
No effusion, infiltrate pneumothorax. Degenerate spurring of spine.
IMPRESSION: Mild bronchitic change.  No acute findings.  Low lung volumes.

## 2019-06-08 IMAGING — CT CT ORBITS W/ CM
3 of 14 series · 15 of 47 positions shown, 18 images · IV contrast (Omni 300)
Comparison: Head CT 07/02/2017.

CLINICAL DATA: Altered mental status.

EXAM:
CT HEAD WITHOUT CONTRAST
CT ORBITS WITH CONTRAST
TECHNIQUE: Contiguous axial images were obtained from the base of the skull
through the vertex without contrast. Multidetector CT imaging of the
orbits was performed using the standard protocol with intravenous
contrast.
CONTRAST:  75mL OMNIPAQUE IOHEXOL 300 MG/ML  SOLN

[Series 4: head bone · axial · 0.46mm/px · z∈[-321,-185]mm · 10 of 84 slices shown, 13 images]
[im 8/84  brain]
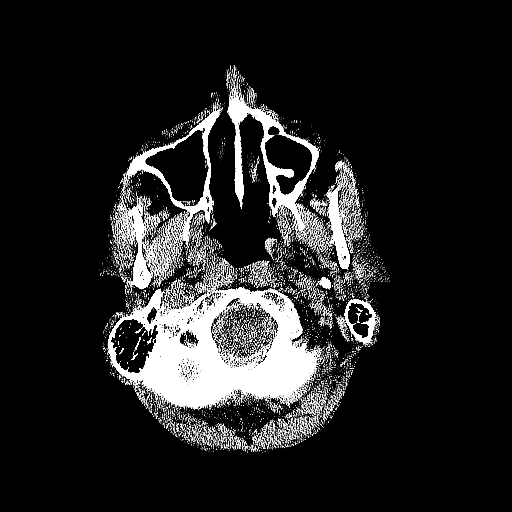
[im 8/84  bone]
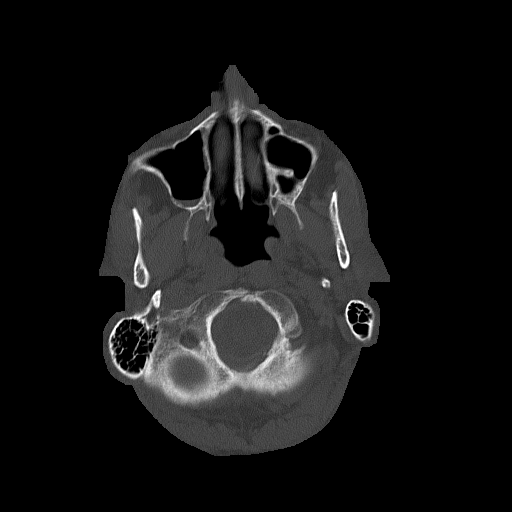
[im 16/84  bone]
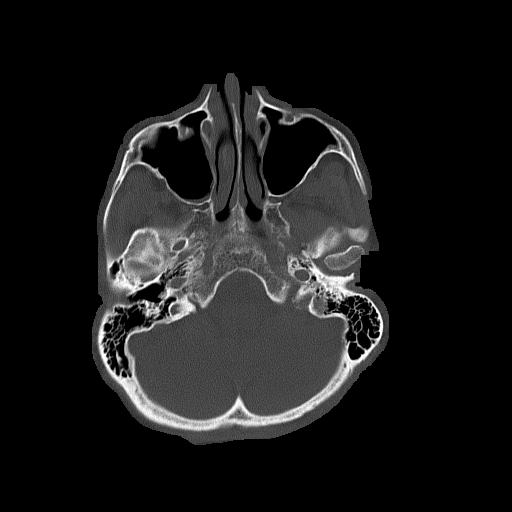
[im 23/84  bone]
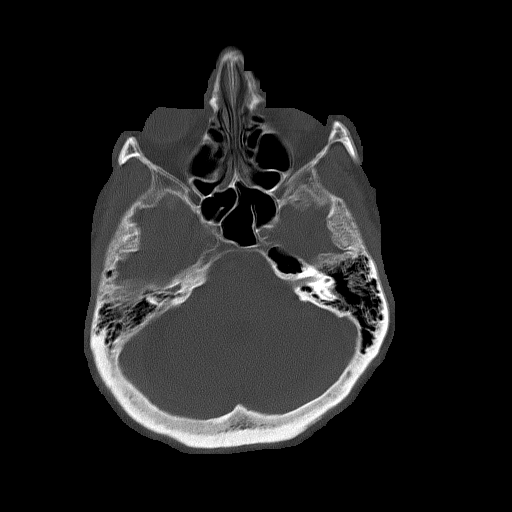
[im 31/84  bone]
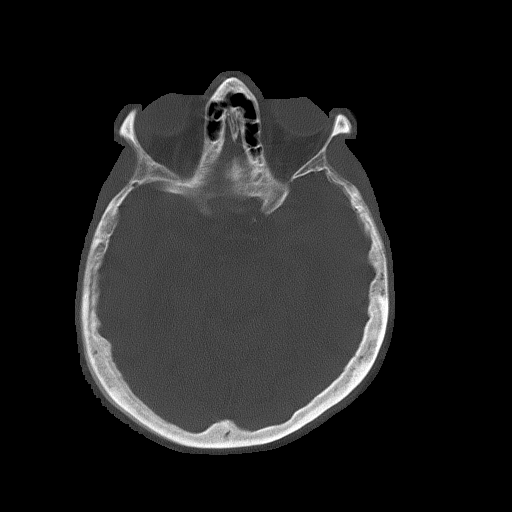
[im 38/84  brain]
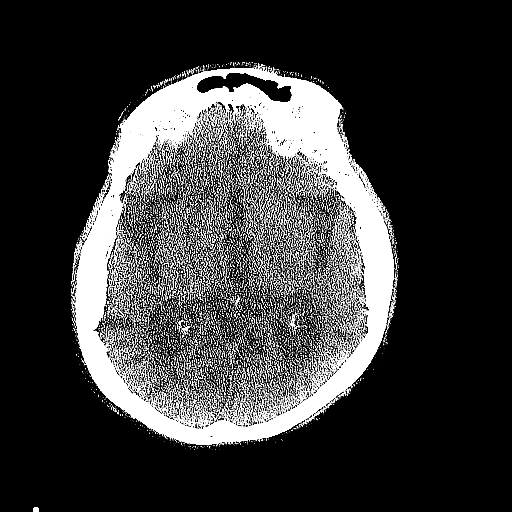
[im 38/84  bone]
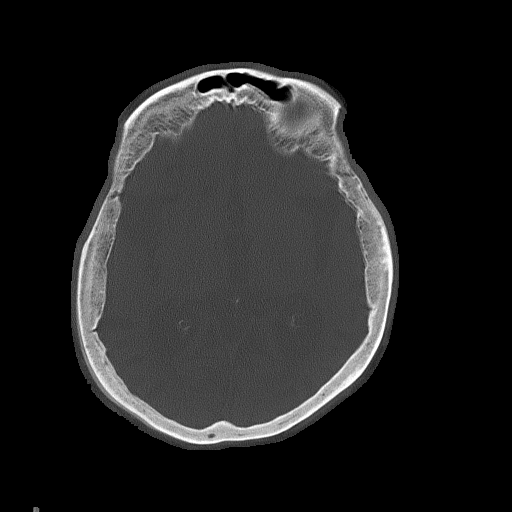
[im 46/84  bone]
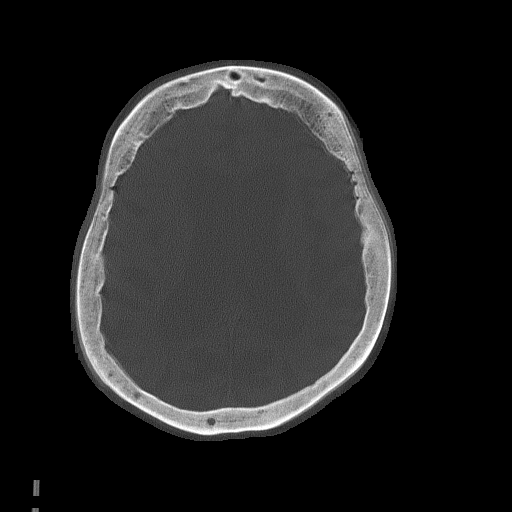
[im 53/84  bone]
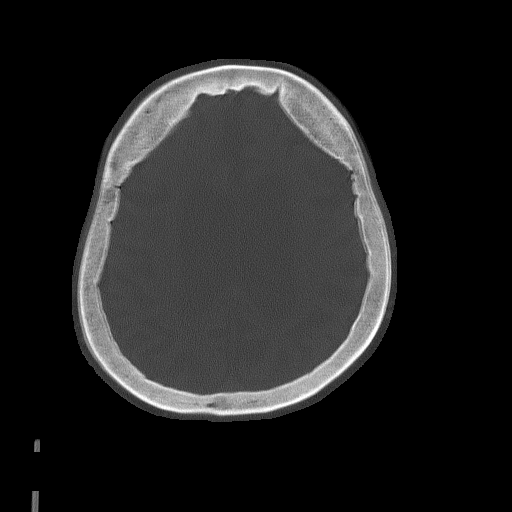
[im 61/84  bone]
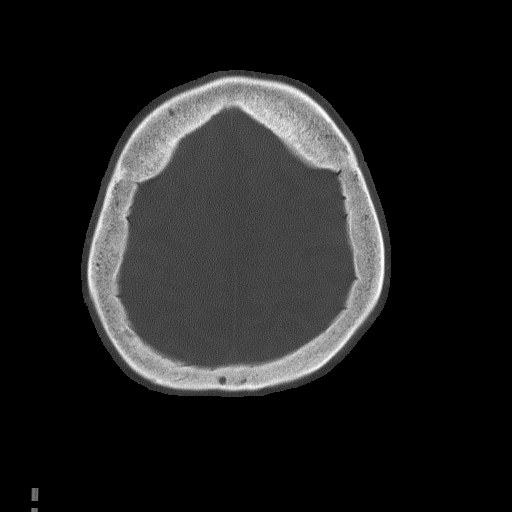
[im 68/84  brain]
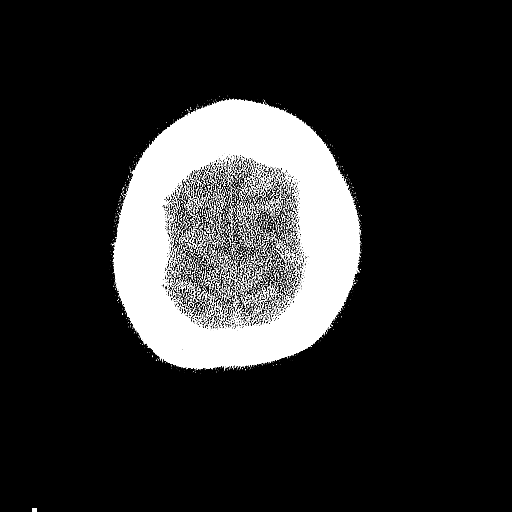
[im 68/84  bone]
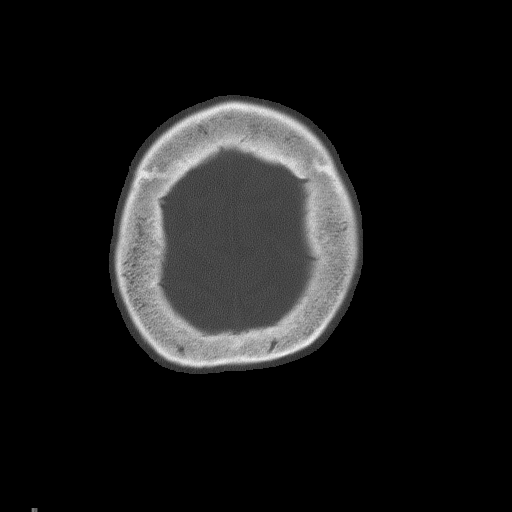
[im 76/84  bone]
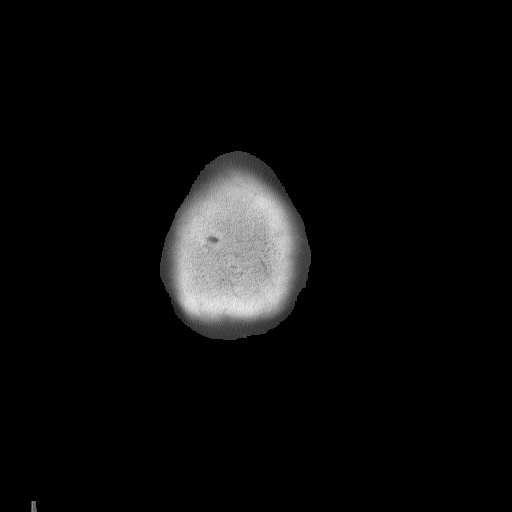

[Series 5: head without cor · coronal · non-contrast · 0.35mm/px · 2 of 71 slices shown]
[im 30/71  bone]
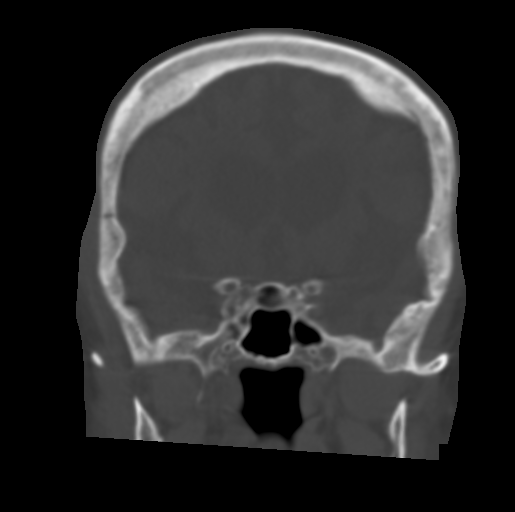
[im 59/71  bone]
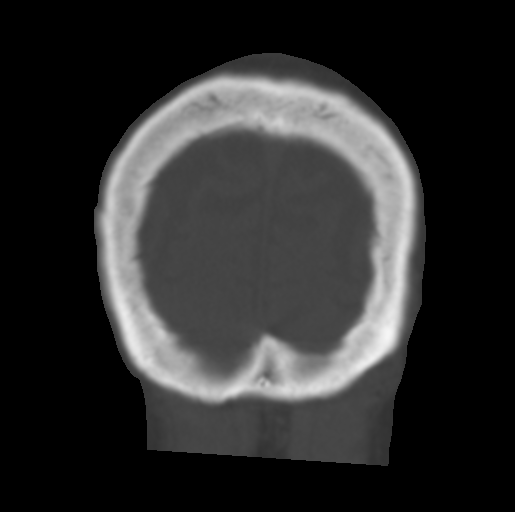

[Series 7: temporalbone 2.0 soft tissue · axial · 0.35mm/px · z∈[-308,-276]mm · 3 of 48 slices shown]
[im 8/48  brain]
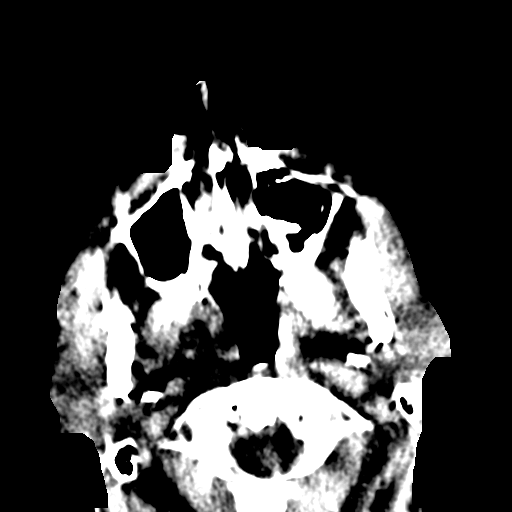
[im 16/48  brain]
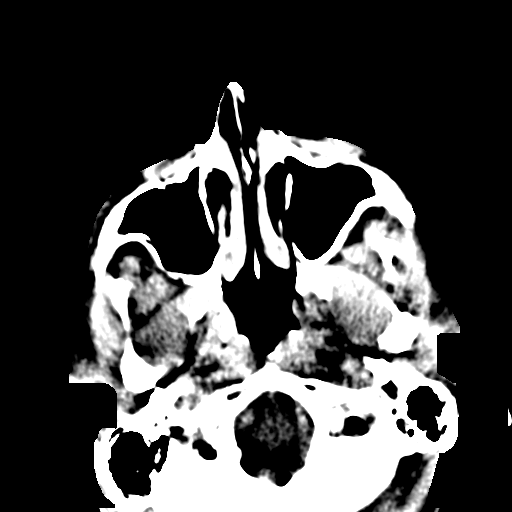
[im 24/48  brain]
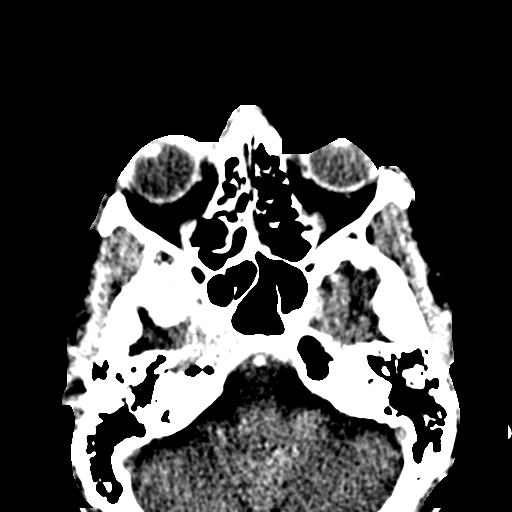

[15 of 47 positions shown; findings below may reference images not displayed]

FINDINGS: CT HEAD FINDINGS

Brain: There is no mass, hemorrhage or extra-axial collection. There
is generalized brain atrophy greater than expected at this age.
There is no acute or chronic infarction. There is hypoattenuation of
the periventricular white matter, most commonly indicating chronic
ischemic microangiopathy.

Vascular: No abnormal hyperdensity of the major intracranial
arteries or dural venous sinuses. No intracranial atherosclerosis.

Skull: The visualized skull base, calvarium and extracranial soft
tissues are normal.

CT ORBITS FINDINGS

The orbits examination is severely motion degraded.

Orbits:

--Globes: Normal.

--Bony orbit: Normal.

--Preseptal soft tissues: There is a small fluid collection just
anterior to the right globe. This may be fluid trapped beneath the
eyelid. There is mild right eyelid edema.

--Intra- and extraconal orbital fat: Normal. No inflammatory
stranding.

--Optic nerves: Normal.

--Lacrimal glands and fossae: Normal.

--Extraocular muscles: Normal.

Visualized sinuses:  No fluid levels or advanced mucosal thickening.

Soft tissues: Normal.
IMPRESSION: 1. No acute intracranial abnormality.
2. Advanced chronic ischemic microangiopathy and parenchymal
atrophy.
3. Severely motion degraded orbital examination.
4. Mild edema of the right eyelid with small underlying fluid
collection between the eyelid and the globe. This may be simple
fluid trapped beneath the eyelid. Superficial inflammatory processes
such as chalazion are also possibilities. An abscess within the
eyelid is less likely. Direct visualization is recommended.
5. No evidence for orbital (postseptal) cellulitis.

## 2019-12-30 DEATH — deceased
